# Patient Record
Sex: Female | Born: 1960 | Race: White | Hispanic: No | Marital: Married | Smoking: Current every day smoker
Health system: Southern US, Community
[De-identification: ages and names within clinical notes are randomized; demographics above are authoritative.]

## PROBLEM LIST (undated history)

## (undated) DIAGNOSIS — C801 Malignant (primary) neoplasm, unspecified: Secondary | ICD-10-CM

## (undated) DIAGNOSIS — I10 Essential (primary) hypertension: Secondary | ICD-10-CM

## (undated) HISTORY — PX: RECTAL SURGERY: SHX760

---

## 2002-12-20 DIAGNOSIS — G5600 Carpal tunnel syndrome, unspecified upper limb: Secondary | ICD-10-CM | POA: Insufficient documentation

## 2005-10-14 ENCOUNTER — Emergency Department: Payer: Self-pay | Admitting: Emergency Medicine

## 2005-10-15 ENCOUNTER — Other Ambulatory Visit: Payer: Self-pay

## 2005-10-15 ENCOUNTER — Emergency Department: Payer: Self-pay | Admitting: Emergency Medicine

## 2006-05-12 ENCOUNTER — Emergency Department: Payer: Self-pay | Admitting: Emergency Medicine

## 2006-08-29 ENCOUNTER — Emergency Department: Payer: Self-pay | Admitting: Emergency Medicine

## 2007-06-02 ENCOUNTER — Emergency Department: Payer: Self-pay | Admitting: Unknown Physician Specialty

## 2007-10-31 DIAGNOSIS — I10 Essential (primary) hypertension: Secondary | ICD-10-CM | POA: Insufficient documentation

## 2008-09-24 ENCOUNTER — Emergency Department: Payer: Self-pay | Admitting: Emergency Medicine

## 2008-11-24 ENCOUNTER — Emergency Department: Payer: Self-pay | Admitting: Emergency Medicine

## 2008-12-06 ENCOUNTER — Emergency Department: Payer: Self-pay | Admitting: Emergency Medicine

## 2009-10-01 ENCOUNTER — Emergency Department: Payer: Self-pay | Admitting: Emergency Medicine

## 2011-05-21 ENCOUNTER — Inpatient Hospital Stay: Payer: Self-pay | Admitting: Internal Medicine

## 2012-06-22 ENCOUNTER — Emergency Department: Payer: Self-pay | Admitting: Emergency Medicine

## 2012-12-28 DIAGNOSIS — C210 Malignant neoplasm of anus, unspecified: Secondary | ICD-10-CM | POA: Insufficient documentation

## 2013-03-23 ENCOUNTER — Emergency Department: Payer: Self-pay | Admitting: Emergency Medicine

## 2013-03-23 LAB — COMPREHENSIVE METABOLIC PANEL
Albumin: 3.8 g/dL (ref 3.4–5.0)
Alkaline Phosphatase: 84 U/L (ref 50–136)
Calcium, Total: 8.8 mg/dL (ref 8.5–10.1)
Co2: 28 mmol/L (ref 21–32)
Osmolality: 261 (ref 275–301)
Potassium: 2.7 mmol/L — ABNORMAL LOW (ref 3.5–5.1)
SGOT(AST): 40 U/L — ABNORMAL HIGH (ref 15–37)
SGPT (ALT): 42 U/L (ref 12–78)
Total Protein: 6.9 g/dL (ref 6.4–8.2)

## 2013-03-23 LAB — CBC
HCT: 38.7 % (ref 35.0–47.0)
MCHC: 36.4 g/dL — ABNORMAL HIGH (ref 32.0–36.0)
MCV: 93 fL (ref 80–100)
Platelet: 126 10*3/uL — ABNORMAL LOW (ref 150–440)
RBC: 4.19 10*6/uL (ref 3.80–5.20)
WBC: 6.8 10*3/uL (ref 3.6–11.0)

## 2013-10-27 ENCOUNTER — Observation Stay: Payer: Self-pay | Admitting: Specialist

## 2013-10-27 LAB — COMPREHENSIVE METABOLIC PANEL
Albumin: 3.9 g/dL (ref 3.4–5.0)
Alkaline Phosphatase: 81 U/L
Anion Gap: 6 — ABNORMAL LOW (ref 7–16)
BUN: 10 mg/dL (ref 7–18)
Bilirubin,Total: 0.8 mg/dL (ref 0.2–1.0)
Calcium, Total: 8.3 mg/dL — ABNORMAL LOW (ref 8.5–10.1)
Chloride: 90 mmol/L — ABNORMAL LOW (ref 98–107)
Co2: 31 mmol/L (ref 21–32)
Creatinine: 0.76 mg/dL (ref 0.60–1.30)
EGFR (African American): >60
EGFR (Non-African Amer.): >60
Glucose: 120 mg/dL — ABNORMAL HIGH (ref 65–99)
Osmolality: 255 (ref 275–301)
Potassium: 2.3 mmol/L — CL (ref 3.5–5.1)
SGOT(AST): 39 U/L — ABNORMAL HIGH (ref 15–37)
SGPT (ALT): 33 U/L (ref 12–78)
Sodium: 127 mmol/L — ABNORMAL LOW (ref 136–145)
Total Protein: 8.1 g/dL (ref 6.4–8.2)

## 2013-10-27 LAB — CBC
HCT: 44.1 % (ref 35.0–47.0)
HGB: 15.6 g/dL (ref 12.0–16.0)
MCH: 33.3 pg (ref 26.0–34.0)
MCHC: 35.4 g/dL (ref 32.0–36.0)
MCV: 94 fL (ref 80–100)
Platelet: 93 10*3/uL — ABNORMAL LOW (ref 150–440)
RBC: 4.69 10*6/uL (ref 3.80–5.20)
RDW: 11.8 % (ref 11.5–14.5)
WBC: 7.8 10*3/uL (ref 3.6–11.0)

## 2013-10-27 LAB — URINALYSIS, COMPLETE
Bacteria: NONE SEEN
Bilirubin,UR: NEGATIVE
Glucose,UR: NEGATIVE mg/dL (ref 0–75)
Leukocyte Esterase: NEGATIVE
Nitrite: NEGATIVE
Ph: 6 (ref 4.5–8.0)
Protein: >=500
RBC,UR: 3 /HPF (ref 0–5)
Specific Gravity: 1.018 (ref 1.003–1.030)
Squamous Epithelial: 2
WBC UR: 8 /HPF (ref 0–5)

## 2013-10-27 LAB — CLOSTRIDIUM DIFFICILE(ARMC)

## 2013-10-27 LAB — LIPASE, BLOOD: Lipase: 81 U/L (ref 73–393)

## 2013-10-27 LAB — MAGNESIUM: Magnesium: 1.6 mg/dL — ABNORMAL LOW

## 2013-10-28 LAB — CBC WITH DIFFERENTIAL/PLATELET
Basophil #: 0 10*3/uL (ref 0.0–0.1)
Basophil %: 0.3 %
EOS ABS: 0 10*3/uL (ref 0.0–0.7)
Eosinophil %: 0.2 %
HCT: 38.6 % (ref 35.0–47.0)
HGB: 13.7 g/dL (ref 12.0–16.0)
Lymphocyte #: 1.8 10*3/uL (ref 1.0–3.6)
Lymphocyte %: 39.2 %
MCH: 33.5 pg (ref 26.0–34.0)
MCHC: 35.4 g/dL (ref 32.0–36.0)
MCV: 95 fL (ref 80–100)
MONO ABS: 0.5 x10 3/mm (ref 0.2–0.9)
MONOS PCT: 10.5 %
NEUTROS ABS: 2.3 10*3/uL (ref 1.4–6.5)
Neutrophil %: 49.8 %
Platelet: 81 10*3/uL — ABNORMAL LOW (ref 150–440)
RBC: 4.08 10*6/uL (ref 3.80–5.20)
RDW: 11.8 % (ref 11.5–14.5)
WBC: 4.7 10*3/uL (ref 3.6–11.0)

## 2013-10-28 LAB — BASIC METABOLIC PANEL
ANION GAP: 6 — AB (ref 7–16)
BUN: 5 mg/dL — ABNORMAL LOW (ref 7–18)
CO2: 28 mmol/L (ref 21–32)
Calcium, Total: 8.1 mg/dL — ABNORMAL LOW (ref 8.5–10.1)
Chloride: 102 mmol/L (ref 98–107)
Creatinine: 0.64 mg/dL (ref 0.60–1.30)
EGFR (Non-African Amer.): >60
GLUCOSE: 96 mg/dL (ref 65–99)
Osmolality: 269 (ref 275–301)
POTASSIUM: 3.3 mmol/L — AB (ref 3.5–5.1)
Sodium: 136 mmol/L (ref 136–145)

## 2013-10-28 LAB — MAGNESIUM: MAGNESIUM: 2 mg/dL

## 2013-10-30 LAB — STOOL CULTURE

## 2013-11-01 LAB — CULTURE, BLOOD (SINGLE)

## 2014-11-30 NOTE — H&P (Signed)
PATIENT NAME:  Lindsey Park, Lindsey Park MR#:  [id number] DATE OF BIRTH:  [birth year]  DATE OF ADMISSION:  10/27/2013  PRIMARY CARE PHYSICIAN: Dr. Delight Stare.   CHIEF COMPLAINT: Fever, chills, runny nose, diarrhea, and weakness.   This is a 54 year old female with a history of hypertension, tobacco dependence, anxiety, presents with the above complaint. Over the past 5 to 6 days, the patient has had fevers, chills, nausea, loose stools (which is now diarrhea today), poor p.o. intake, and lethargy. She is not improving, so she came here for further evaluation. She said she has had loose stools all week, but today she has had a lot of watery diarrhea. She is unable to take in anything for the past few days. She has tried to eat, but she just feels nauseous every time she eats. She denies any abdominal pain. She has a cough, which is dry. She also has a runny nose. In the ER, she had a temperature of 101. Her potassium and magnesium were low.  REVIEW OF SYSTEMS:   CONSTITUTIONAL: Positive fever. Positive fatigue and weakness.  EYES: No blurred or double vision, glaucoma or cataracts. EARS, NOSE, THROAT: Positive headache. Positive hard of hearing. No postnasal drip. No epistaxis, tinnitus, snoring,  RESPIRATORY: Positive cough. No wheezing, hemoptysis, dyspnea, asthma, painful respirations.  CARDIOVASCULAR: Denies any chest pain, orthopnea, palpitations, syncope, edema.  GASTROINTESTINAL: Positive nausea. No vomiting. Positive diarrhea. No abdominal pain. No melena or ulcers.  GENITOURINARY: No dysuria or hematuria. ENDOCRINE: No polyuria or polydipsia. HEMATOLOGIC AND LYMPHATIC: No anemia or easy bruising. SKIN: No rash or lesions.  MUSCULOSKELETAL: No pain in the shoulders or knees. No arthritis.  NEUROLOGIC: No history of CVA, TIA, seizures. PSYCHIATRIC: Positive anxiety.   PAST MEDICAL HISTORY: 1.  Hypertension.  2.  Anal cancer.  3.  Anxiety.  4.  Tobacco dependence.  SOCIAL HISTORY: The patient  smokes 1 pack every 2 days. No alcohol or IV drug use. The patient was counseled for 4 minutes regarding smoking. She is trying to cut down on smoking. She does not want a nicotine patch.   PAST SURGICAL HISTORY:  1.  Anal cancer surgery.  2.  Hysterectomy.  FAMILY HISTORY: Positive for depression.   ALLERGIES: SULFA CAUSED A RASH. AMBIEN CAUSED ANXIETY.   MEDICATIONS: 1.  Atenolol/chlorthalidone 50/25, 1 tablet daily.  2.  Xanax 0.5 mg t.i.d.   PHYSICAL EXAMINATION: VITAL SIGNS: Temperature 101, pulse 105, respirations 20, blood pressure 134/80, 93% on room air.  GENERAL: The patient is alert, oriented, not in acute distress. HEENT: Head is atraumatic. Pupils are round and reactive. Sclerae anicteric. Mucous membranes are dry. Oropharynx is clear.  NECK: Supple without JVD, carotid bruit, or enlarged thyroid.  CARDIOVASCULAR: Tachycardia. No murmurs, gallops, or rubs. PMI is not displaced. LUNGS: Clear to auscultation without crackles, rales, rhonchi, or wheezing. Normal to percussion. No egophony. Normal chest expansion.  BACK: No CVA or vertebral tenderness.  ABDOMEN: Bowel sounds are positive. Nontender, nondistended. No hepatosplenomegaly.  No rebound or guarding.  EXTREMITIES: No clubbing, cyanosis, or edema.  NEUROLOGIC: Cranial nerves II through XII are intact. There are no focal deficits.  SKIN: Without rashes or lesions.  MUSCULOSKELETAL: Shows 5/5 strength in all extremities.   LABORATORY, DIAGNOSTIC, AND RADIOLOGICAL DATA: Sodium 127, potassium 2.3, chloride 90, bicarbonate 31, BUN 10, creatinine 0.76, glucose 120, calcium 8.3, bilirubin 0.8, alkaline phosphatase 81, ALT 33, AST 39, total protein 8.1, albumin 3.9. White blood cells 7.8, hemoglobin 15.6, hematocrit 45; platelets are 93. Lipase  81. Urinalysis is negative for LCE or nitrites. Magnesium 1.6.   Chest x-ray shows no acute cardiopulmonary disease.   EKG: Normal sinus rhythm. No ST elevation or depression.    ASSESSMENT AND PLAN: This is a 54 year old female with a history of hypertension, tobacco dependence, and anxiety who presents with systemic inflammatory response syndrome secondary to likely viral syndrome.  1.  Systemic inflammatory response syndrome. The patient has tachycardia and fever on admission, likely secondary to viral syndrome. Her symptoms are most consistent with a viral syndrome with fever, chills, cough, and runny nose. She now has developed some diarrhea. Blood cultures were ordered in the ER, which we will follow up on. No antibiotics at this time.  2.  Significant electrolyte abnormalities including hypokalemia and hypomagnesemia secondary to poor p.o. intake and diarrhea. We will replete and recheck in the a.m.  3.  Hyponatremia secondary to poor p.o. intake. The patient is also on a diuretic hypertensive medication, which we will stop for now, which could lead to low sodium levels as well. Will replete with IV fluids. Recheck in the a.m.  4.  Diarrhea. The patient has had increasing diarrhea this morning. Will check C. difficile as well as stool cultures. No antibiotics at this time, as I suspect this is viral syndrome.  5.  Tobacco dependence. The patient was counseled regarding stopping smoking for 4 minutes. She is in the process of decreasing her cigarette use. Not interested in totally quitting at this point. She does not want a nicotine patch.  6.  Anxiety. Will continue with her outpatient medications.  7.  Thrombocytopenia, likely secondary to systemic inflammatory response syndrome. Will repeat a CBC in the a.m. Holding Lovenox or heparin for deep vein thrombosis prophylaxis due to her low platelets.  8.  The patient is full code status.   TIME SPENT: Approximately 45 minutes.    ____________________________ Donell Beers. Benjie Karvonen, MD spm:jcm D: 10/27/2013 13:50:59 ET T: 10/27/2013 14:45:21 ET JOB#: [id number]  cc: Marisa Hage P. Benjie Karvonen, MD, <Dictator> Marguerita Merles, MD Donell Beers  Christyna Letendre MD ELECTRONICALLY SIGNED 10/27/2013 19:04

## 2014-11-30 NOTE — Discharge Summary (Signed)
PATIENT NAME:  Lindsey Park, Lindsey Park MR#:  [id number] DATE OF BIRTH:  23-Dec-1960  DATE OF ADMISSION:  10/27/2013 DATE OF DISCHARGE:  10/28/2013  For a detailed note, please take a look at the history and physical done on admission by Dr. Benjie Karvonen.  DISCHARGE DIAGNOSES:  1. Viral gastroenteritis.  2.  Hypokalemia secondary to the diarrhea. 3.  Hyponatremia, also related to volume loss from the diarrhea.  4.  Hypertension.  5.  Anxiety.   DISCHARGE DIET: The patient is being discharged on a low-sodium, regular consistency diet.   DISCHARGE ACTIVITY: As tolerated.   DISCHARGE INSTRUCTIONS: Follow up with Dr. Delight Stare in the next 1 to 2 weeks.  DISCHARGE MEDICATIONS: Atenolol/chlorthalidone 50/25 one tab daily, Xanax 0.5 mg t.i.d. as needed, loperamide 2 mg q.i.d. as needed for diarrhea, and potassium 10 mEq b.i.d. x5 days.   PERTINENT STUDIES DONE DURING THE HOSPITAL COURSE: A chest x-ray done on admission showing no acute cardiopulmonary disease.   The patient's blood culture is noted to be negative. Stool cultures are negative. C. diff toxin assay also negative.   HOSPITAL COURSE: This is a 54 year old female who presented to the hospital with fever, diarrhea, and body aches, and noted to also have several electrolyte abnormalities with severe hypokalemia and also hyponatremia.  1.  Viral gastroenteritis. This was likely the cause of the patient's diarrhea, fever, and body aches. The patient with supportive care has significantly improved. She was hydrated with IV fluids, given antiemetics, antidiarrheals. She still continues to have diarrhea, but it has improved. She has been afebrile now for the past 24 hours. Her blood cultures are negative. Her stool was negative for C. diff and comprehensive culture. Her electrolytes have improved with supplementation. At this point, she will continue some p.r.n. Imodium for her diarrhea and follow up with her primary care physician as an outpatient.  2.   Hypokalemia. The patient's potassium was as low as 2.3. It has come up to 3.3 overnight with supplementation. I am discharging her on some oral potassium for the next few days.  3.  Hyponatremia. This was likely secondary to hypovolemic hyponatremia from the diarrhea and volume loss. The patient has received some IV fluids and her sodium has since then improved.  4.  Hypertension. The patient has remained hemodynamically stable. She will continue her atenolol and chlorthalidone.  5.  Anxiety. The patient was maintained on her Xanax and she will resume that.   CODE STATUS: The patient is a FULL code.   TIME SPENT: 35 minutes.   ____________________________ Belia Heman. Verdell Carmine, MD vjs:sb D: 10/28/2013 16:34:53 ET T: 10/29/2013 07:23:39 ET JOB#: [id number]  cc: Belia Heman. Verdell Carmine, MD, <Dictator> Marguerita Merles, MD Henreitta Leber MD ELECTRONICALLY SIGNED 11/02/2013 20:27

## 2014-12-03 DIAGNOSIS — R200 Anesthesia of skin: Secondary | ICD-10-CM | POA: Insufficient documentation

## 2014-12-09 DIAGNOSIS — M5430 Sciatica, unspecified side: Secondary | ICD-10-CM | POA: Insufficient documentation

## 2014-12-28 DIAGNOSIS — R7401 Elevation of levels of liver transaminase levels: Secondary | ICD-10-CM | POA: Insufficient documentation

## 2014-12-28 DIAGNOSIS — R7402 Elevation of levels of lactic acid dehydrogenase (LDH): Secondary | ICD-10-CM | POA: Insufficient documentation

## 2015-01-17 ENCOUNTER — Emergency Department
Admission: EM | Admit: 2015-01-17 | Discharge: 2015-01-17 | Disposition: A | Discharge status: Home / Self Care | Payer: Self-pay | Attending: Emergency Medicine | Admitting: Emergency Medicine

## 2015-01-17 ENCOUNTER — Encounter: Payer: Self-pay | Admitting: Emergency Medicine

## 2015-01-17 ENCOUNTER — Other Ambulatory Visit: Payer: Self-pay

## 2015-01-17 DIAGNOSIS — F419 Anxiety disorder, unspecified: Secondary | ICD-10-CM | POA: Insufficient documentation

## 2015-01-17 DIAGNOSIS — I10 Essential (primary) hypertension: Secondary | ICD-10-CM | POA: Insufficient documentation

## 2015-01-17 DIAGNOSIS — Z79899 Other long term (current) drug therapy: Secondary | ICD-10-CM | POA: Insufficient documentation

## 2015-01-17 DIAGNOSIS — Z720 Tobacco use: Secondary | ICD-10-CM | POA: Insufficient documentation

## 2015-01-17 HISTORY — DX: Malignant (primary) neoplasm, unspecified: C80.1

## 2015-01-17 HISTORY — DX: Essential (primary) hypertension: I10

## 2015-01-17 MED ORDER — ALPRAZOLAM 0.5 MG PO TABS: 0.5000 mg | ORAL_TABLET | Freq: Three times a day (TID) | ORAL | Status: DC | PRN

## 2015-01-17 MED ORDER — PANTOPRAZOLE SODIUM 20 MG PO TBEC: 20.0000 mg | DELAYED_RELEASE_TABLET | Freq: Every day | ORAL | Status: DC

## 2015-01-17 MED ORDER — ACETAMINOPHEN 500 MG PO TABS
1000.0000 mg | ORAL_TABLET | ORAL | Status: AC
  Administered 2015-01-17: 1000 mg via ORAL

## 2015-01-17 MED ORDER — ALPRAZOLAM 0.5 MG PO TABS
ORAL_TABLET | ORAL | Status: AC
Start: 2015-01-17 — End: 2015-01-17
  Administered 2015-01-17: 1 mg via ORAL
  Filled 2015-01-17: qty 2

## 2015-01-17 MED ORDER — ALPRAZOLAM 0.5 MG PO TABS
1.0000 mg | ORAL_TABLET | ORAL | Status: AC
  Administered 2015-01-17: 1 mg via ORAL

## 2015-01-17 MED ORDER — ACETAMINOPHEN 500 MG PO TABS
ORAL_TABLET | ORAL | Status: AC
  Administered 2015-01-17: 1000 mg via ORAL
  Filled 2015-01-17: qty 2

## 2015-01-17 NOTE — ED Notes (Signed)
Pt resting with friends at bedside. Lights turned down to help her rest.

## 2015-01-17 NOTE — ED Notes (Signed)
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 Pt states she is sleeping well but feeling overwhelmed.

## 2015-01-17 NOTE — ED Notes (Signed)
Provided pt with [place] sandwich. HA improving since tylenol.

## 2015-01-17 NOTE — ED Provider Notes (Signed)
[facility] Emergency Department Provider Note  ____________________________________________  Time seen: Approximately 10:22 AM  I have reviewed the triage vital signs and the nursing notes.   HISTORY  Chief Complaint Anxiety and Hypertension    HPI Lindsey Park is a 54 y.o. female states that she's been under a lot of stress recently T and care of her family member. She notes she went to work today and "just couldn't take it" because she feels so overwhelmed with life. She started to "panic" and states that she started feeling numb and tingling in her fingers and was hyperventilating. She denies having any pain. She has been treated previously for anxiety but stopped medication 6 months ago, she was also treated with some type of anti-present for about a month and stopped that because it wasn't helping.  She denies wanting to harm herself or anyone else. She states she would never hurt herself. She is feels that she doesn't have helped, and that her family doesn't care about her husbands  Sickness.  She reports feeling very anxious. He does not feel hopeless.  She has no personal history of suicidal thoughts. She does suffer from depression and anxiety.  Past Medical History  Diagnosis Date  . Hypertension   . Cancer     There are no active problems to display for this patient.   Past Surgical History  Procedure Laterality Date  . Rectal surgery      Current Outpatient Rx  Name  Route  Sig  Dispense  Refill  . ALPRAZolam (XANAX) 0.5 MG tablet   Oral   Take 1 tablet (0.5 mg total) by mouth 3 (three) times daily as needed for anxiety.   30 tablet   0   . pantoprazole (PROTONIX) 20 MG tablet   Oral   Take 1 tablet (20 mg total) by mouth daily.   30 tablet   1     Allergies Sulfa antibiotics  History reviewed. No pertinent family history.  Social History History  Substance Use Topics  . Smoking status: Current Every Day Smoker --  0.50 packs/day for 20 years    Types: Cigarettes  . Smokeless tobacco: Never Used  . Alcohol Use: 2.4 oz/week    4 Cans of beer per week    Review of Systems Constitutional: No fever/chills Eyes: No visual changes. ENT: No sore throat. Cardiovascular: Denies chest pain. Respiratory: She felt short of breath during her "panic" but this is resolved. Gastrointestinal: No abdominal pain.  No nausea, no vomiting.  No diarrhea.  No constipation. Genitourinary: Negative for dysuria. Musculoskeletal: Negative for back pain. Skin: Negative for rash. Neurological: Negative for headaches, focal weakness or numbness.  10-point ROS otherwise negative.  ____________________________________________   PHYSICAL EXAM:  VITAL SIGNS: ED Triage Vitals  Enc Vitals Group     BP 01/17/15 0747 170/108 mmHg     Pulse Rate 01/17/15 0747 89     Resp 01/17/15 0747 18     Temp 01/17/15 0747 98 F (36.7 C)     Temp Source 01/17/15 0747 Oral     SpO2 01/17/15 0747 98 %     Weight 01/17/15 0747 140 lb (63.504 kg)     Height 01/17/15 0747 5\' 6"  (1.676 m)     Head Cir --      Peak Flow --      Pain Score 01/17/15 0749 6     Pain Loc --      Pain Edu? --  Excl. in [place]? --     Constitutional: Alert and oriented. Well appearing and in no acute distress. She is tearful, stating that she "shouldn't have come here" but she " just panicked" and being under stress Eyes: Conjunctivae are normal. PERRL. EOMI. Head: Atraumatic. Nose: No congestion/rhinnorhea. Mouth/Throat: Mucous membranes are moist.  Oropharynx non-erythematous. Neck: No stridor.   Cardiovascular: Normal rate, regular rhythm. Grossly normal heart sounds.  Good peripheral circulation. Respiratory: Normal respiratory effort.  No retractions. Lungs CTAB. Gastrointestinal: Soft and nontender. No distention. No abdominal bruits. No CVA tenderness. Musculoskeletal: No lower extremity tenderness nor edema.  No joint effusions. Neurologic:   Normal speech and language. No gross focal neurologic deficits are appreciated. Speech is normal. No gait instability. Skin:  Skin is warm, dry and intact. No rash noted. Psychiatric: Mood and affect are normal. Speech and behavior are normal.  Anxious appearing, otherwise normal exam.  ____________________________________________   LABS (all labs ordered are listed, but only abnormal results are displayed)  Labs Reviewed - No data to display ____________________________________________  EKG   Date: 01/17/2015  Rate: 80  Rhythm: normal sinus rhythm  QRS Axis: normal  Intervals: normal except QTc of 490  ST/T Wave abnormalities: normal  Conduction Disutrbances: none  Narrative Interpretation: unremarkable except for mild prolongation of QT     ____________________________________________  RADIOLOGY   ____________________________________________   PROCEDURES  Procedure(s) performed: None  Critical Care performed: No  ____________________________________________   INITIAL IMPRESSION / ASSESSMENT AND PLAN / ED COURSE  Pertinent labs & imaging results that were available during my care of the patient were reviewed by me and considered in my medical decision making (see chart for details).  Patient presents with what appears to been a panic episode because of being under heavy stress in her life. She denies being homicidal or suicidal. I discussed with her, and she does want to have follow-up with psychiatry and for treatment of the stress in her life. She does have support from close friends were with her today. They all deny that she would never hurt herself, and that she is not talked about any thoughts of hurting herself or being suicidal.  I think the patient is suffering from depression and anxiety. I believe in her instance, a brief course of Xanax and close follow-up is now patient would be appropriate. Her EKG is normal, and her symptoms match that of a panic  type episode. I will have TTS see her to assist in setting up outpatient care.  ----------------------------------------- 11:20 AM on 01/17/2015 -----------------------------------------  Patient is calm. She appears much more comfortable. She does states she's having a moderate headache, but feels that she has headaches on and off frequently because of stress. This is no different than previous headaches. She denies any numbness or tingling and she is fully awake and alert and well-appearing.  Continue to await consultation from TTS. I anticipate discharging her home. I did discuss with the patient safe use of Xanax and only use as prescribed. We'll give her very short course until she can get follow-up with psychiatric care and her doctor. Patient does not take any narcotic pain medicines. She does not take any other benzodiazepine's. She does occasionally take Flexeril, but agrees not to use this during the same days her periods when she is using Xanax. She agrees not to drive while using Xanax. ____________________________________________  ----------------------------------------- 2:04 PM on 01/17/2015 -----------------------------------------  Patient resting comfortably. Seen by TTS and will follow up with [facility] on  Monday. I will give her a brief prescription for Xanax, she also ran out of her Protonix which I will give her a one-month refill.   FINAL CLINICAL IMPRESSION(S) / ED DIAGNOSES  Final diagnoses:  Anxiety      Delman Kitten, MD 01/17/15 1408

## 2015-01-17 NOTE — ED Notes (Signed)
Pt BIB [facility] from work. Per pt she woke up this AM not feeling herself. She is out of BP medicine and has been off anxiety medicine for 6 months. States she got to work late and was emotional, crying and hyperventilating. Pt coworkers called 911. Per EMS, [facility] and [facility]. Pt also reports headache starting today.

## 2015-01-17 NOTE — BH Assessment (Signed)
Assessment Note  Lindsey Park is an 54 y.o. female who presents to the ER due to having a panic attack at work. According to the pt., as she prepared for work today, going through her daily routine, she "wasn't feeling right." She stated she was having cold chills and shakiness. However, she went to work due to being the only one in her home working. When she arrived at work, one of her regular customers, jokingly said, "you are late." It resulted in the patient, crying uncontrollably, shaking and confused. Her blood pressure was evaluated and felt like she was "going to pass out." Thus, she was brought to the ER.  Pt. Reports of having ongoing stress due to being the primary care giver for her husband. Pt. States, her husband is chronically ill and have work with Hospice on two separate occasions.  However, when they believe he is in about to expire, his health improves. He has  "chronic COPD."  The husbands, PCP has arranged for the wife to get additional help, via an in home aid. However, the husband refused to allow them to come to and in the house.  When she talked to him about needing the additional help, because it is to overwhelming for her, he would make statements that make her feel guilty. Pt. Further reports of being the only one working. 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   Pt. Has been with her husband for 18 years. The last 6 years, she has been his care taker due to his COPD.  She states she has limited support from her family. She states, "no one checks on me but I'm always checking on them."  She has four children and her mother. She did identified having two close friends, they were in the room during the time of the interview. She states they are a support for her. However, they are limited to what they can do to help.  Pt. Was prescribed Xanax from there PCP. However, due to her not taking a urine test with the office,  they discontinued to the medications. Pt. Denies drug use. The day she didn't submit the UDS, she reports of forgetting to do it. Per her report, she seen her PCP and was suppose to submit the specimen.  However, she walked to the front, scheduled hr next appointment and forgot about it. Thus, she received a letter, several days latter, stating the doctor was discontinued her medication.  She reports of being on it for approximately 7 years. She has gone without for approximately 6 months. Until today, the pt. Believe she was managing her anxiety.  Pt. Denies current and past SI/HI and AV/H. She denies any involvement with the legal symptom.  She also denies having a history mental health treatment. However, today she is going to seek an out patient provider. Writer provider the pt. With referral information, with instructions, for [facility].     Axis I: Anxiety Disorder NOS and Panic Disorder Axis III:  Past Medical History  Diagnosis Date  . Hypertension   . Cancer    Axis IV: economic problems, housing problems, occupational problems, other psychosocial or environmental problems, problems related to social environment and problems with primary support group  Past Medical History:  Past Medical History  Diagnosis Date  . Hypertension   . Cancer     Past Surgical History  Procedure Laterality Date  . Rectal surgery  Family History: History reviewed. No pertinent family history.  Social History:  reports that she has been smoking Cigarettes.  She has a 10 pack-year smoking history. She has never used smokeless tobacco. She reports that she drinks about 2.4 oz of alcohol per week. She reports that she does not use illicit drugs.  Additional Social History:  Alcohol / Drug Use Pain Medications: None Reported Prescriptions: None Reported Over the Counter: None Reported History of alcohol / drug use?: No history of alcohol / drug abuse Negative Consequences of Use:  (None  Reported) Withdrawal Symptoms:  (None Reported)  CIWA: CIWA-Ar BP: (!) 148/99 mmHg Pulse Rate: 89 COWS:    Allergies:  Allergies  Allergen Reactions  . Sulfa Antibiotics Shortness Of Breath and Rash    Home Medications:  (Not in a hospital admission)  OB/GYN Status:  No LMP recorded. Patient has had a hysterectomy.  General Assessment Data Location of Assessment: [facility] ED TTS Assessment: In system Is this a Tele or Face-to-Face Assessment?: Face-to-Face Is this an Initial Assessment or a Re-assessment for this encounter?: Initial Assessment Marital status: Married [place] name: n/a Is patient pregnant?: No Pregnancy Status: No Living Arrangements: Spouse/significant other Can pt return to current living arrangement?: Yes Admission Status: Voluntary Is patient capable of signing voluntary admission?: Yes Referral Source: Self/Family/Friend Insurance type: n/a  Medical Screening Exam ([employer]) Medical Exam completed: Yes  Crisis Care Plan Living Arrangements: Spouse/significant other Name of Psychiatrist: n/a Name of Therapist: n/a  Education Status Is patient currently in school?: No Current Grade: n/a Highest grade of school patient has completed: 12 Name of school: n/a Contact person: n/a  Risk to self with the past 6 months Suicidal Ideation: No Has patient been a risk to self within the past 6 months prior to admission? : No Suicidal Intent: No Has patient had any suicidal intent within the past 6 months prior to admission? : No Is patient at risk for suicide?: No Suicidal Plan?: No Has patient had any suicidal plan within the past 6 months prior to admission? : No Access to Means: No What has been your use of drugs/alcohol within the last 12 months?: None reported Previous Attempts/Gestures: No How many times?: 0 Other Self Harm Risks: None Reported Triggers for Past Attempts: Family contact, Other (Comment) (Pt. stressed due to being  overwhelmed) Intentional Self Injurious Behavior: None Family Suicide History: Unknown Recent stressful life event(s): Loss (Comment), Trauma (Comment), Financial Problems, Other (Comment) Persecutory voices/beliefs?: No Depression: Yes Depression Symptoms: Guilt, Fatigue, Tearfulness, Feeling worthless/self pity, Feeling angry/irritable Substance abuse history and/or treatment for substance abuse?: No Suicide prevention information given to non-admitted patients: Not applicable  Risk to Others within the past 6 months Homicidal Ideation: No Does patient have any lifetime risk of violence toward others beyond the six months prior to admission? : No Thoughts of Harm to Others: No Current Homicidal Intent: No Current Homicidal Plan: No Access to Homicidal Means: No Identified Victim: None Reported History of harm to others?: No Assessment of Violence: None Noted Violent Behavior Description: None Reported Does patient have access to weapons?: No Criminal Charges Pending?: No Does patient have a court date: No Is patient on probation?: No  Psychosis Hallucinations: None noted Delusions: None noted  Mental Status Report Appearance/Hygiene: In hospital gown, In scrubs, Unremarkable Eye Contact: Good Motor Activity: Freedom of movement, Unremarkable, Restlessness Speech: Logical/coherent Level of Consciousness: Alert Mood: Depressed, Anxious, Sad, Pleasant Affect: Anxious, Sad Anxiety Level: Moderate Thought Processes: Coherent, Relevant  Judgement: Unimpaired Orientation: Person, Place, Time, Situation, Appropriate for developmental age Obsessive Compulsive Thoughts/Behaviors: Minimal  Cognitive Functioning Concentration: Normal Memory: Recent Intact, Remote Intact IQ: Average Insight: Fair Impulse Control: Fair Appetite: Fair Weight Loss: 0 Weight Gain: 0 Sleep: No Change Total Hours of Sleep: 8 Vegetative Symptoms: None  ADLScreening [facility] Assessment  Services) Patient's cognitive ability adequate to safely complete daily activities?: Yes Patient able to express need for assistance with ADLs?: Yes Independently performs ADLs?: Yes (appropriate for developmental age)  Prior Inpatient Therapy Prior Inpatient Therapy: No Prior Therapy Dates: n/a Prior Therapy Facilty/Provider(s): n/a Reason for Treatment: n/a  Prior Outpatient Therapy Prior Outpatient Therapy: No Prior Therapy Dates: n/a Prior Therapy Facilty/Provider(s): n/a Reason for Treatment: n/a Does patient have an ACCT team?: No Does patient have Intensive In-House Services?  : No Does patient have [facility] services? : No Does patient have P4CC services?: No  ADL Screening (condition at time of admission) Patient's cognitive ability adequate to safely complete daily activities?: Yes Patient able to express need for assistance with ADLs?: Yes Independently performs ADLs?: Yes (appropriate for developmental age)       Abuse/Neglect Assessment (Assessment to be complete while patient is alone) Physical Abuse: Denies Verbal Abuse: Denies Sexual Abuse: Denies Exploitation of patient/patient's resources: Denies Self-Neglect: Denies Values / Beliefs Cultural Requests During Hospitalization: None Spiritual Requests During Hospitalization: None Consults Spiritual Care Consult Needed: No Social Work Consult Needed: No Regulatory affairs officer (For Healthcare) Does patient have an advance directive?: No Would patient like information on creating an advanced directive?: No - patient declined information    Additional Information 1:1 In Past 12 Months?: No CIRT Risk: No Elopement Risk: No Does patient have medical clearance?: Yes  Child/Adolescent Assessment Running Away Risk: Denies (Pt. is an adult)  Disposition:  Disposition Initial Assessment Completed for this Encounter: Yes Disposition of Patient: Outpatient treatment Type of outpatient treatment: Adult  ([facility])  On Site Evaluation by:   Reviewed with Physician:    Gunnar Fusi, MS, LCAS, LPC, [place], CCSI 01/17/2015 2:56 PM

## 2015-01-17 NOTE — Discharge Instructions (Signed)

## 2015-02-11 DIAGNOSIS — B182 Chronic viral hepatitis C: Secondary | ICD-10-CM | POA: Insufficient documentation

## 2015-03-28 ENCOUNTER — Encounter: Payer: Self-pay | Admitting: Emergency Medicine

## 2015-03-28 ENCOUNTER — Other Ambulatory Visit: Payer: Self-pay

## 2015-03-28 ENCOUNTER — Emergency Department
Admission: EM | Admit: 2015-03-28 | Discharge: 2015-03-28 | Disposition: A | Discharge status: Home / Self Care | Payer: Self-pay | Attending: Emergency Medicine | Admitting: Emergency Medicine

## 2015-03-28 ENCOUNTER — Emergency Department: Payer: Self-pay

## 2015-03-28 DIAGNOSIS — R1010 Upper abdominal pain, unspecified: Secondary | ICD-10-CM

## 2015-03-28 DIAGNOSIS — R1011 Right upper quadrant pain: Secondary | ICD-10-CM | POA: Insufficient documentation

## 2015-03-28 DIAGNOSIS — Z720 Tobacco use: Secondary | ICD-10-CM | POA: Insufficient documentation

## 2015-03-28 DIAGNOSIS — I10 Essential (primary) hypertension: Secondary | ICD-10-CM | POA: Insufficient documentation

## 2015-03-28 DIAGNOSIS — R740 Nonspecific elevation of levels of transaminase and lactic acid dehydrogenase [LDH]: Secondary | ICD-10-CM | POA: Insufficient documentation

## 2015-03-28 DIAGNOSIS — E876 Hypokalemia: Secondary | ICD-10-CM | POA: Insufficient documentation

## 2015-03-28 DIAGNOSIS — Z79899 Other long term (current) drug therapy: Secondary | ICD-10-CM | POA: Insufficient documentation

## 2015-03-28 DIAGNOSIS — R7401 Elevation of levels of liver transaminase levels: Secondary | ICD-10-CM

## 2015-03-28 LAB — COMPREHENSIVE METABOLIC PANEL
ALT: 122 U/L — ABNORMAL HIGH (ref 14–54)
ANION GAP: 9 (ref 5–15)
AST: 144 U/L — AB (ref 15–41)
Albumin: 4.1 g/dL (ref 3.5–5.0)
Alkaline Phosphatase: 63 U/L (ref 38–126)
BUN: 8 mg/dL (ref 6–20)
CHLORIDE: 98 mmol/L — AB (ref 101–111)
CO2: 30 mmol/L (ref 22–32)
Calcium: 9.2 mg/dL (ref 8.9–10.3)
Creatinine, Ser: 0.59 mg/dL (ref 0.44–1.00)
Glucose, Bld: 112 mg/dL — ABNORMAL HIGH (ref 65–99)
POTASSIUM: 3.1 mmol/L — AB (ref 3.5–5.1)
Sodium: 137 mmol/L (ref 135–145)
Total Bilirubin: 0.7 mg/dL (ref 0.3–1.2)
Total Protein: 7.6 g/dL (ref 6.5–8.1)

## 2015-03-28 LAB — URINALYSIS COMPLETE WITH MICROSCOPIC (ARMC ONLY)
BACTERIA UA: NONE SEEN
Bilirubin Urine: NEGATIVE
Glucose, UA: NEGATIVE mg/dL
HGB URINE DIPSTICK: NEGATIVE
KETONES UR: NEGATIVE mg/dL
LEUKOCYTES UA: NEGATIVE
NITRITE: NEGATIVE
PH: 8 (ref 5.0–8.0)
PROTEIN: NEGATIVE mg/dL
RBC / HPF: NONE SEEN RBC/hpf (ref 0–5)
Specific Gravity, Urine: 1.004 — ABNORMAL LOW (ref 1.005–1.030)

## 2015-03-28 LAB — CBC
HEMATOCRIT: 43.1 % (ref 35.0–47.0)
Hemoglobin: 15 g/dL (ref 12.0–16.0)
MCH: 33.6 pg (ref 26.0–34.0)
MCHC: 34.9 g/dL (ref 32.0–36.0)
MCV: 96.2 fL (ref 80.0–100.0)
Platelets: 109 10*3/uL — ABNORMAL LOW (ref 150–440)
RBC: 4.48 MIL/uL (ref 3.80–5.20)
RDW: 12.8 % (ref 11.5–14.5)
WBC: 4.6 10*3/uL (ref 3.6–11.0)

## 2015-03-28 LAB — LIPASE, BLOOD: LIPASE: 26 U/L (ref 22–51)

## 2015-03-28 LAB — HCG, QUANTITATIVE, PREGNANCY: hCG, Beta Chain, Quant, S: 2 m[IU]/mL (ref <5)

## 2015-03-28 MED ORDER — IOHEXOL 240 MG/ML SOLN
25.0000 mL | Freq: Once | INTRAMUSCULAR | Status: AC | PRN
  Administered 2015-03-28: 25 mL via ORAL

## 2015-03-28 MED ORDER — POTASSIUM CHLORIDE ER 10 MEQ PO TBCR: 10.0000 meq | EXTENDED_RELEASE_TABLET | Freq: Every day | ORAL | Status: DC

## 2015-03-28 MED ORDER — POTASSIUM CHLORIDE CRYS ER 20 MEQ PO TBCR
40.0000 meq | EXTENDED_RELEASE_TABLET | Freq: Once | ORAL | Status: AC
  Administered 2015-03-28: 40 meq via ORAL
  Filled 2015-03-28: qty 2

## 2015-03-28 MED ORDER — ONDANSETRON HCL 4 MG/2ML IJ SOLN
4.0000 mg | INTRAMUSCULAR | Status: AC
  Administered 2015-03-28: 4 mg via INTRAVENOUS
  Filled 2015-03-28: qty 2

## 2015-03-28 MED ORDER — MORPHINE SULFATE (PF) 4 MG/ML IV SOLN
4.0000 mg | Freq: Once | INTRAVENOUS | Status: AC
  Administered 2015-03-28: 4 mg via INTRAVENOUS
  Filled 2015-03-28: qty 1

## 2015-03-28 MED ORDER — IOHEXOL 350 MG/ML SOLN
85.0000 mL | Freq: Once | INTRAVENOUS | Status: AC | PRN
Start: 2015-03-28 — End: 2015-03-28
  Administered 2015-03-28: 85 mL via INTRAVENOUS

## 2015-03-28 NOTE — Discharge Instructions (Signed)
Your liver function tests are slightly abnormal, but the CT scan today is reassuring along with the rest of your tests. Please follow-up with Dr. Lennox Grumbles early this coming week, and keep your appointment with [facility] gastroenterology. He may alternatively follow up with Dr. Vira Agar of our gastroenterology service here, but either should be appropriate.  Come back to emergency room right away if you develop a fever, yellowing of the skin or eyes, severe abdominal pain, have chest pain, feel weak or dizzy, or other new concerns arise.  Abdominal Pain Many things can cause abdominal pain. Usually, abdominal pain is not caused by a disease and will improve without treatment. It can often be observed and treated at home. Your health care provider will do a physical exam and possibly order blood tests and X-rays to help determine the seriousness of your pain. However, in many cases, more time must pass before a clear cause of the pain can be found. Before that point, your health care provider may not know if you need more testing or further treatment. HOME CARE INSTRUCTIONS  Monitor your abdominal pain for any changes. The following actions may help to alleviate any discomfort you are experiencing:  Only take over-the-counter or prescription medicines as directed by your health care provider.  Do not take laxatives unless directed to do so by your health care provider.  Try a clear liquid diet (broth, tea, or water) as directed by your health care provider. Slowly move to a bland diet as tolerated. SEEK MEDICAL CARE IF:  You have unexplained abdominal pain.  You have abdominal pain associated with nausea or diarrhea.  You have pain when you urinate or have a bowel movement.  You experience abdominal pain that wakes you in the night.  You have abdominal pain that is worsened or improved by eating food.  You have abdominal pain that is worsened with eating fatty foods.  You have a fever. SEEK  IMMEDIATE MEDICAL CARE IF:   Your pain does not go away within 2 hours.  You keep throwing up (vomiting).  Your pain is felt only in portions of the abdomen, such as the right side or the left lower portion of the abdomen.  You pass bloody or black tarry stools. MAKE SURE YOU:  Understand these instructions.   Will watch your condition.   Will get help right away if you are not doing well or get worse.  Document Released: 05/05/2005 Document Revised: 07/31/2013 Document Reviewed: 04/04/2013 [employer] Patient Information 2015 [place], [place]. This information is not intended to replace advice given to you by your health care provider. Make sure you discuss any questions you have with your health care provider.

## 2015-03-28 NOTE — ED Provider Notes (Signed)
[facility] Emergency Department Provider Note  ____________________________________________  Time seen: Approximately 8:28 AM  I have reviewed the triage vital signs and the nursing notes.   HISTORY  Chief Complaint Abdominal Pain and Nausea    HPI Lindsey Park is a 54 y.o. female reports that she's been having abdominal discomfort and feeling like her abdomen is slightly swollen for approximately the last week. She is been seeing her primary care doctor recently has been told that her liver function tests of  abnormal and has been referred, but has not yet seenat [facility].  She denies any fevers or chills. No nausea or vomiting. No chest pain or trouble breathing. She just reports an aching and off-and-on discomfort in the abdomen, and feeling slightly bloated.  No history of drug abuse. No heavy alcohol use. No history of hepatitis. He is not throwing up. She is notyellowing of her skin or eyes. No trouble urinating.   Past Medical History  Diagnosis Date  . Hypertension   . Cancer     There are no active problems to display for this patient.   Past Surgical History  Procedure Laterality Date  . Rectal surgery      Current Outpatient Rx  Name  Route  Sig  Dispense  Refill  . atenolol (TENORMIN) 50 MG tablet   Oral   Take 50 mg by mouth daily.         . hydrochlorothiazide (HYDRODIURIL) 25 MG tablet   Oral   Take 25 mg by mouth daily.         . pantoprazole (PROTONIX) 20 MG tablet   Oral   Take 1 tablet (20 mg total) by mouth daily.   30 tablet   1   . traZODone (DESYREL) 100 MG tablet   Oral   Take 100 mg by mouth at bedtime as needed for sleep.         . potassium chloride (K-DUR) 10 MEQ tablet   Oral   Take 1 tablet (10 mEq total) by mouth daily.   5 tablet   0     Allergies Sulfa antibiotics  No family history on file.  Social History Social History  Substance Use Topics  . Smoking status: Current Every Day  Smoker -- 0.50 packs/day for 20 years    Types: Cigarettes  . Smokeless tobacco: Never Used  . Alcohol Use: 2.4 oz/week    4 Cans of beer per week    Review of Systems Constitutional: No fever/chills Eyes: No visual changes. ENT: No sore throat. Cardiovascular: Denies chest pain. Respiratory: Denies shortness of breath. Gastrointestinal: See history of present illness No nausea, no vomiting.  No diarrhea.  No constipation. Genitourinary: Negative for dysuria. Musculoskeletal: Negative for back pain. Skin: Negative for rash. Neurological: Negative for headaches, focal weakness or numbness.  10-point ROS otherwise negative.  ____________________________________________   PHYSICAL EXAM:  VITAL SIGNS: ED Triage Vitals  Enc Vitals Group     BP --      Pulse --      Resp --      Temp --      Temp src --      SpO2 --      Weight --      Height --      Head Cir --      Peak Flow --      Pain Score 03/28/15 0725 8     Pain Loc --  Pain Edu? --      Excl. in [place]? --     Constitutional: Alert and oriented. Well appearing and in no acute distress. Eyes: Conjunctivae are normal. PERRL. EOMI. Head: Atraumatic. Nose: No congestion/rhinnorhea. Mouth/Throat: Mucous membranes are moist.  Oropharynx non-erythematous. Neck: No stridor.   Cardiovascular: Normal rate, regular rhythm. Grossly normal heart sounds.  Good peripheral circulation. Respiratory: Normal respiratory effort.  No retractions. Lungs CTAB. Gastrointestinal: Soft and nontender except for some minimal discomfort in the right upper quadrant without rebound or guarding. Negative Murphy. No peritoneal signs.. No distention. No abdominal bruits. No CVA tenderness. Musculoskeletal: No lower extremity tenderness nor edema.  No joint effusions. Neurologic:  Normal speech and language. No gross focal neurologic deficits are appreciated. No gait instability. Skin:  Skin is warm, dry and intact. No rash  noted. Psychiatric: Mood and affect are normal. Speech and behavior are normal.  ____________________________________________   LABS (all labs ordered are listed, but only abnormal results are displayed)  Labs Reviewed  COMPREHENSIVE METABOLIC PANEL - Abnormal; Notable for the following:    Potassium 3.1 (*)    Chloride 98 (*)    Glucose, Bld 112 (*)    AST 144 (*)    ALT 122 (*)    All other components within normal limits  CBC - Abnormal; Notable for the following:    Platelets 109 (*)    All other components within normal limits  URINALYSIS COMPLETEWITH MICROSCOPIC ([facility] ONLY) - Abnormal; Notable for the following:    Color, Urine YELLOW (*)    APPearance CLEAR (*)    Specific Gravity, Urine 1.004 (*)    Squamous Epithelial / LPF 0-5 (*)    All other components within normal limits  LIPASE, BLOOD  HCG, QUANTITATIVE, PREGNANCY  HEPATITIS PANEL, ACUTE  ANTINUCLEAR ANTIBODIES, IFA   ____________________________________________  EKG  ED ECG REPORT I, Rand Etchison, the attending physician, personally viewed and interpreted this ECG.  Date: 03/28/2015 EKG Time: 750 Rate: 65 Rhythm: normal sinus rhythm QRS Axis: normal Intervals: normal ST/T Wave abnormalities: normal Conduction Disutrbances: none Narrative Interpretation: unremarkable  ____________________________________________  RADIOLOGY   IMPRESSION: 1. No acute abdominal/pelvic findings, mass lesions or adenopathy. 2. Status posthysterectomy. 3. Moderate atherosclerotic calcifications involving the aorta but no aneurysm or dissection. ____________________________________________   PROCEDURES  Procedure(s) performed: None  Critical Care performed: No  ____________________________________________   INITIAL IMPRESSION / ASSESSMENT AND PLAN / ED COURSE  Pertinent labs & imaging results that were available during my care of the patient were reviewed by me and considered in my medical decision making  (see chart for details).  Patient presents with 1-2 weeks of upper abdominal discomfort and a feeling of slightly bloated. She has no systemic symptoms. Afebrile with a very reassuring exam with some slight right upper quadrant tenderness. Because of a slight transaminitis, we will obtain CT imaging to evaluate for secondary signs of cirrhosis, cholecystitis, biliary disease, or other right upper quadrant etiologies. The remainder of exam is essentially normal. No acute cardiopulmonary symptoms.  ----------------------------------------- 10:38 AM on 03/28/2015 -----------------------------------------  Patient feeling improved, she is ambulatory. No distress. I discussed with Dr. Vira Agar of our gastroenterology service, he suggested adding a hepatitis panel and a N/A. I have done this, he is happy to see the patient in clinic or she can follow up with [facility] gastroenterology as hours scheduled. However for her back to her primary care doctor for ongoing workup and follow-up care.  I discussed the patient return precautions, she'll  return to emergency room away if she develops any chest pain or trouble breathing, she has any fevers or chills, nausea or vomiting, develops yellowing of the skin, has severe abdominal pain, or other new concerns or symptoms arise.  Patient agrees not to drive for the next 6 hours as she was given morphine in the emergency room. ____________________________________________   FINAL CLINICAL IMPRESSION(S) / ED DIAGNOSES  Final diagnoses:  Transaminitis  Upper abdominal pain  Hypokalemia      Delman Kitten, MD 03/28/15 1043

## 2015-03-28 NOTE — ED Notes (Signed)
abd pain and swelling since last Saturday along with some nausea. Recent weight loss over the past two mths.

## 2015-03-31 LAB — HEPATITIS PANEL, ACUTE
Hep A IgM: NEGATIVE
Hep B C IgM: NEGATIVE
Hepatitis B Surface Ag: NEGATIVE

## 2015-03-31 LAB — ANTINUCLEAR ANTIBODIES, IFA: ANA Ab, IFA: NEGATIVE

## 2016-01-14 DIAGNOSIS — K7460 Unspecified cirrhosis of liver: Secondary | ICD-10-CM | POA: Insufficient documentation

## 2016-03-01 ENCOUNTER — Emergency Department
Admission: EM | Admit: 2016-03-01 | Discharge: 2016-03-01 | Disposition: A | Discharge status: Home / Self Care | Payer: Self-pay | Attending: Emergency Medicine | Admitting: Emergency Medicine

## 2016-03-01 ENCOUNTER — Emergency Department: Payer: Self-pay

## 2016-03-01 ENCOUNTER — Encounter: Payer: Self-pay | Admitting: Emergency Medicine

## 2016-03-01 DIAGNOSIS — W010XXA Fall on same level from slipping, tripping and stumbling without subsequent striking against object, initial encounter: Secondary | ICD-10-CM | POA: Insufficient documentation

## 2016-03-01 DIAGNOSIS — I10 Essential (primary) hypertension: Secondary | ICD-10-CM | POA: Insufficient documentation

## 2016-03-01 DIAGNOSIS — S20212A Contusion of left front wall of thorax, initial encounter: Secondary | ICD-10-CM | POA: Insufficient documentation

## 2016-03-01 DIAGNOSIS — Y929 Unspecified place or not applicable: Secondary | ICD-10-CM | POA: Insufficient documentation

## 2016-03-01 DIAGNOSIS — F17210 Nicotine dependence, cigarettes, uncomplicated: Secondary | ICD-10-CM | POA: Insufficient documentation

## 2016-03-01 DIAGNOSIS — Y939 Activity, unspecified: Secondary | ICD-10-CM | POA: Insufficient documentation

## 2016-03-01 DIAGNOSIS — Y999 Unspecified external cause status: Secondary | ICD-10-CM | POA: Insufficient documentation

## 2016-03-01 NOTE — ED Triage Notes (Signed)
Patient presents to the ED with left rib pain post fall 9 days ago.  Patient states it is painful to laugh and breathe deeply.  Patient ambulatory to triage without obvious difficulty.

## 2016-03-01 NOTE — ED Notes (Signed)
Upon entering patients room to review discharge instructions patient is not in room. Per Olivia Mackie, NT "She just walked out". Dr. Reita Cliche notified and aware. Dr. Reita Cliche spoke with patient in regards to discharge before patient left. Discharge pain assessment, vital signs, and discharge e signature unable to be obtained. This RN did not review discharge instructions.

## 2016-03-01 NOTE — Discharge Instructions (Signed)
You were evaluated for continuing left rib pain after a fall, and your x-ray is reassuring, although as we discussed I suspect he may have a hairline fracture. You may increase your ibuprofen from 1 tablet each 24-hour to 2 tablets every 6-8 hours throughout the day for pain control.  Return to the emergency department for any worsening condition including chest pain, trouble breathing, dizziness or passing out, palpitations, or any other symptoms concerning to you.

## 2016-03-01 NOTE — ED Provider Notes (Signed)
[facility] Emergency Department Provider Note   ____________________________________________  Time seen: Approximately 4:25 PM I have reviewed the triage vital signs and the triage nursing note.  HISTORY  Chief Complaint Fall   Historian Patient  HPI Lindsey Park is a 55 y.o. female who fell about a week and a half ago as she stumbled out her door and landed onto her left side, experiencing bruising to the arm as well as the left chest wall. She thought she just had bruising and so she waited and has been taking only 200 mg of ibuprofen once per day because she was told she has a "liver condition. "She is continued to have some discomfort especially with palpation. 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  She wanted to make sure that she didn't have a rib fracture which is why she came in today because the pain has not gone away after the week and half.  Pain is moderate. Movement and palpation make it worse.    Past Medical History:  Diagnosis Date  . Cancer ([place])   . Hypertension     There are no active problems to display for this patient.   Past Surgical History:  Procedure Laterality Date  . RECTAL SURGERY      Current Outpatient Rx  . Order #: [id number] Class: Historical Med  . Order #: [id number] Class: Historical Med  . Order #: [id number] Class: Print  . Order #: [id number] Class: Print  . Order #: [id number] Class: Historical Med  Atenolol, hydrocodone, Protonix, Drucilla Chalet, trazodone  Allergies Sulfa antibiotics  No family history on file.  Social History Social History  Substance Use Topics  . Smoking status: Current Every Day Smoker    Packs/day: 0.50    Years: 20.00    Types: Cigarettes  . Smokeless tobacco: Never Used  . Alcohol use 2.4 oz/week    4 Cans of beer per week    Review of Systems  Constitutional: Negative for fever. Eyes: Negative for visual changes. ENT: Negative for sore  throat. Cardiovascular: Negative for Any central chest pain or palpitations. Respiratory: Negative for shortness of breath. Feels limited in breathing due to the pain on the left side of the chest wall when she takes deep breaths. Gastrointestinal: Negative for abdominal pain, vomiting and diarrhea. Genitourinary: Negative for dysuria. Musculoskeletal: Negative for back pain. Skin: Negative for rash. Neurological: Negative for headache. 10 point Review of Systems otherwise negative ____________________________________________   PHYSICAL EXAM:  VITAL SIGNS: ED Triage Vitals  Enc Vitals Group     BP 03/01/16 1345 (!) 149/88     Pulse Rate 03/01/16 1345 78     Resp 03/01/16 1530 17     Temp 03/01/16 1345 97.7 F (36.5 C)     Temp Source 03/01/16 1345 Oral     SpO2 03/01/16 1345 98 %     Weight 03/01/16 1342 123 lb (55.8 kg)     Height 03/01/16 1342 5\' 7"  (1.702 m)     Head Circumference --      Peak Flow --      Pain Score 03/01/16 1343 10     Pain Loc --      Pain Edu? --      Excl. in [place]? --      Constitutional: Alert and oriented. Well appearing and in no distress. HEENT   Head: Normocephalic and atraumatic.      Eyes: Conjunctivae are normal. PERRL. Normal extraocular movements.  Ears:         Nose: No congestion/rhinnorhea.   Mouth/Throat: Mucous membranes are moist.   Neck: No stridor. Cardiovascular/Chest: Normal rate, regular rhythm.  No murmurs, rubs, or gallops.  Bruising/ecchymosis older to the lateral left chest wall, tender to point palpation at the lateral lower rib margin on the left side. Respiratory: Normal respiratory effort without tachypnea nor retractions. Breath sounds are clear and equal bilaterally. No wheezes/rales/rhonchi. Gastrointestinal: Soft. No distention, no guarding, no rebound. Nontender.    Genitourinary/rectal:Deferred Musculoskeletal: Appearing bruising to the right forearm. Nontender with normal range of motion in all  extremities. No joint effusions.  No lower extremity tenderness.  No edema. Neurologic:  Normal speech and language. No gross or focal neurologic deficits are appreciated. Skin:  Skin is warm, dry and intact. No rash noted. Psychiatric: Mood and affect are normal. Speech and behavior are normal. Patient exhibits appropriate insight and judgment.  ____________________________________________   EKG I, Lisa Roca, MD, the attending physician have personally viewed and interpreted all ECGs.  None ____________________________________________  LABS (pertinent positives/negatives)  Labs Reviewed - No data to display  ____________________________________________  RADIOLOGY All Xrays were viewed by me. Imaging interpreted by Radiologist.  Chest two-view: No edema or consolidation. __________________________________________  PROCEDURES  Procedure(s) performed: None  Critical Care performed: None  ____________________________________________   ED COURSE / ASSESSMENT AND PLAN  Pertinent labs & imaging results that were available during my care of the patient were reviewed by me and considered in my medical decision making (see chart for details).    This patient came in requesting an x-ray to make sure she didn't have any rib fractures given that she still having point tenderness to the left of the chest wall where she fell about a week ago and has still appearing ecchymosis and point tenderness to palpation. She's not hypoxic. She is having no central chest pain not concerned about ACS. Not concerned about PE.  I do think that she probably does have underlying clinical rib fracture although the x-ray are clear.  Patient has been taking 200 mg of ibuprofen once per day, I indicated to her that she could go ahead and increase this to at least 400 mg every 8 hours for better control. We discussed deep breathing. She does have a primary care physician to follow up  with.    CONSULTATIONS: None   Patient / Family / Caregiver informed of clinical course, medical decision-making process, and agree with plan.   I discussed return precautions, follow-up instructions, and discharged instructions with patient and/or family.   ___________________________________________   FINAL CLINICAL IMPRESSION(S) / ED DIAGNOSES   Final diagnoses:  Rib contusion, left, initial encounter              Note: This dictation was prepared with Dragon dictation. Any transcriptional errors that result from this process are unintentional    Lisa Roca, MD 03/01/16 1901

## 2016-03-01 NOTE — ED Notes (Signed)
Patient states she fell 1 week ago. Patient states she waited to come in because she thought they were "just bruised". Patient guarding area. Denies LOC or hitting head. Patient is not on blood thinners.

## 2016-03-01 NOTE — ED Notes (Signed)
MD at bedside. 

## 2016-03-01 NOTE — ED Notes (Signed)
Pt given cup of water 

## 2017-10-19 ENCOUNTER — Encounter: Payer: Self-pay | Admitting: Emergency Medicine

## 2017-10-19 ENCOUNTER — Emergency Department: Payer: Self-pay

## 2017-10-19 ENCOUNTER — Emergency Department
Admission: EM | Admit: 2017-10-19 | Discharge: 2017-10-19 | Disposition: A | Discharge status: Home / Self Care | Payer: Self-pay | Attending: Emergency Medicine | Admitting: Emergency Medicine

## 2017-10-19 DIAGNOSIS — Y929 Unspecified place or not applicable: Secondary | ICD-10-CM | POA: Insufficient documentation

## 2017-10-19 DIAGNOSIS — Y939 Activity, unspecified: Secondary | ICD-10-CM | POA: Insufficient documentation

## 2017-10-19 DIAGNOSIS — S8392XA Sprain of unspecified site of left knee, initial encounter: Secondary | ICD-10-CM | POA: Insufficient documentation

## 2017-10-19 DIAGNOSIS — Y990 Civilian activity done for income or pay: Secondary | ICD-10-CM | POA: Insufficient documentation

## 2017-10-19 DIAGNOSIS — W19XXXA Unspecified fall, initial encounter: Secondary | ICD-10-CM | POA: Insufficient documentation

## 2017-10-19 DIAGNOSIS — Z79899 Other long term (current) drug therapy: Secondary | ICD-10-CM | POA: Insufficient documentation

## 2017-10-19 DIAGNOSIS — Z859 Personal history of malignant neoplasm, unspecified: Secondary | ICD-10-CM | POA: Insufficient documentation

## 2017-10-19 DIAGNOSIS — I10 Essential (primary) hypertension: Secondary | ICD-10-CM | POA: Insufficient documentation

## 2017-10-19 DIAGNOSIS — F17210 Nicotine dependence, cigarettes, uncomplicated: Secondary | ICD-10-CM | POA: Insufficient documentation

## 2017-10-19 MED ORDER — IBUPROFEN 600 MG PO TABS: 600.0000 mg | ORAL_TABLET | Freq: Three times a day (TID) | ORAL | 0 refills | Status: DC | PRN

## 2017-10-19 MED ORDER — HYDROCODONE-ACETAMINOPHEN 5-325 MG PO TABS: 1.0000 | ORAL_TABLET | Freq: Four times a day (QID) | ORAL | 0 refills | Status: DC | PRN

## 2017-10-19 MED ORDER — HYDROCODONE-ACETAMINOPHEN 5-325 MG PO TABS
1.0000 | ORAL_TABLET | Freq: Once | ORAL | Status: AC
  Administered 2017-10-19: 1 via ORAL
  Filled 2017-10-19: qty 1

## 2017-10-19 NOTE — Discharge Instructions (Signed)
Follow-up with your primary care provider or Dr. Marry Guan if any continued problems with your knee.  Wear knee immobilizer when up walking.  You do not need to wear it when in bed.  Begin taking ibuprofen 600 mg 3 times daily with food.  Norco if needed for moderate to severe pain.  You cannot drive or operate machinery while taking the Norco.  Ice and elevate your leg as needed for swelling and pain.

## 2017-10-19 NOTE — ED Triage Notes (Signed)
Pt states she was walking to a table at work, and her leg gave out and caused her to fall, denies syncope, tearful in triage, states her left knee hit the floor first, c/o tingling in her left leg at this time post fall.

## 2017-10-19 NOTE — ED Provider Notes (Signed)
[facility] Emergency Department Provider Note   ____________________________________________   First MD Initiated Contact with Patient 10/19/17 1032     (approximate)  I have reviewed the triage vital signs and the nursing notes.   HISTORY  Chief Complaint Knee Pain   HPI Lindsey Park is a 57 y.o. female is here with complaint of left knee pain.  Patient states that she was walking to a work table when her knee gave out on her causing her to fall.  She complains of "tingling" in her left leg at this time.  She is brought to the ED by EMS.  She denies any head injury or loss of consciousness.  Patient states that she has hurt the same knee in the past but did not require surgery.  She rates her pain as a 10/10.   Past Medical History:  Diagnosis Date  . Cancer ([place])   . Hypertension     There are no active problems to display for this patient.   Past Surgical History:  Procedure Laterality Date  . RECTAL SURGERY      Prior to Admission medications   Medication Sig Start Date End Date Taking? Authorizing Provider  atenolol (TENORMIN) 50 MG tablet Take 50 mg by mouth daily.    [provider]  hydrochlorothiazide (HYDRODIURIL) 25 MG tablet Take 25 mg by mouth daily.    [provider]  HYDROcodone-acetaminophen (NORCO/VICODIN) 5-325 MG tablet Take 1 tablet by mouth every 6 (six) hours as needed for moderate pain. 10/19/17   Johnn Hai, PA-C  ibuprofen (ADVIL,MOTRIN) 600 MG tablet Take 1 tablet (600 mg total) by mouth every 8 (eight) hours as needed. 10/19/17   Johnn Hai, PA-C  pantoprazole (PROTONIX) 20 MG tablet Take 1 tablet (20 mg total) by mouth daily. 01/17/15 01/17/16  Delman Kitten, MD  potassium chloride (K-DUR) 10 MEQ tablet Take 1 tablet (10 mEq total) by mouth daily. 03/28/15   Delman Kitten, MD  traZODone (DESYREL) 100 MG tablet Take 100 mg by mouth at bedtime as needed for sleep.    [provider]     Allergies Sulfa antibiotics  No family history on file.  Social History Social History   Tobacco Use  . Smoking status: Current Every Day Smoker    Packs/day: 0.50    Years: 20.00    Pack years: 10.00    Types: Cigarettes  . Smokeless tobacco: Never Used  Substance Use Topics  . Alcohol use: Yes    Alcohol/week: 2.4 oz    Types: 4 Cans of beer per week  . Drug use: No    Review of Systems Constitutional: No fever/chills Cardiovascular: Denies chest pain. Respiratory: Denies shortness of breath. Gastrointestinal: No abdominal pain.  No nausea, no vomiting.  Musculoskeletal: Positive for left knee pain. Skin: Negative for rash. Neurological: Negative for headaches.  ____________________________________________   PHYSICAL EXAM:  VITAL SIGNS: ED Triage Vitals  Enc Vitals Group     BP 10/19/17 1025 (!) 173/78     Pulse Rate 10/19/17 1025 71     Resp 10/19/17 1025 18     Temp 10/19/17 1025 97.9 F (36.6 C)     Temp Source 10/19/17 1025 Oral     SpO2 10/19/17 1025 98 %     Weight 10/19/17 1026 127 lb (57.6 kg)     Height 10/19/17 1026 5\' 7"  (1.702 m)     Head Circumference --      Peak Flow --  Pain Score 10/19/17 1025 10     Pain Loc --      Pain Edu? --      Excl. in [place]? --    Constitutional: Alert and oriented. Well appearing and in no acute distress. Eyes: Conjunctivae are normal.  Head: Atraumatic. Neck: No stridor.   Cardiovascular: Normal rate, regular rhythm. Grossly normal heart sounds.  Good peripheral circulation. Respiratory: Normal respiratory effort.  No retractions. Lungs CTAB. Musculoskeletal: On examination of the left knee there is no gross deformity and no obvious effusion present.  Patient is markedly tender to palpation to the patella and anterior portion of the knee.  Range of motion is restricted secondary to patient's pain.  Patient was able to stand secondary to pain.  Secondary to pain patient's ligaments were not tested.  She  was given Norco while in the emergency department.  Pain was intact.  No ecchymosis or abrasions were seen. Neurologic:  Normal speech and language. No gross focal neurologic deficits are appreciated.  Skin:  Skin is warm, dry and intact. No rash noted.  Psychiatric: Mood and affect are normal. Speech and behavior are normal.  ____________________________________________   LABS (all labs ordered are listed, but only abnormal results are displayed)  Labs Reviewed - No data to display  RADIOLOGY  ED MD interpretation:  Left knee x-ray is negative for acute fracture.  Official radiology report(s): Dg Knee Complete 4 Views Left  Result Date: 10/19/2017 CLINICAL DATA:  Pt reports slipping and falling at work, where her left leg bent under her and she fell on it. Pt states she has pain all over the knee and is unable to externally rotate. EXAM: LEFT KNEE - COMPLETE 4+ VIEW COMPARISON:  06/22/2012 FINDINGS: No evidence of fracture, dislocation, or joint effusion. No evidence of arthropathy or other focal bone abnormality. Soft tissues are unremarkable. IMPRESSION: Negative. Electronically Signed   By: Lucrezia Europe M.D.   On: 10/19/2017 11:58    ____________________________________________   PROCEDURES  Procedure(s) performed:   .Splint Application Date/Time: 9/73/5329 6:03 PM Performed by: Harlen Labs, NT Authorized by: Johnn Hai, PA-C   Consent:    Consent obtained:  Verbal   Consent given by:  Patient   Risks discussed:  Pain   Alternatives discussed:  Referral Pre-procedure details:    Sensation:  Normal Procedure details:    Laterality:  Left   Location:  Knee   Knee:  L knee   Strapping: yes     Splint type:  Knee immobilizer Post-procedure details:    Pain:  Unchanged   Sensation:  Normal   Patient tolerance of procedure:  Tolerated well, no immediate complications    Critical Care performed:  No  ____________________________________________   INITIAL IMPRESSION / ASSESSMENT AND PLAN / ED COURSE  As part of my medical decision making, I reviewed the following data within the electronic MEDICAL RECORD NUMBER Notes from prior ED visits and  Controlled Substance Database  Patient was placed in a knee immobilizer and instructed to ice and elevate her knee today.  She is to follow-up with Dr. Marry Guan if any continued problems with her knee.  She was given a prescription for Norco 1 every 6 hours as needed for pain and ibuprofen 600 mg every 8 hours with food.  Patient is instructed to use knee immobilizer when up walking.    ____________________________________________   FINAL CLINICAL IMPRESSION(S) / ED DIAGNOSES  Final diagnoses:  Sprain of left knee, unspecified ligament, initial  encounter     ED Discharge Orders        Ordered    HYDROcodone-acetaminophen (NORCO/VICODIN) 5-325 MG tablet  Every 6 hours PRN     10/19/17 1216    ibuprofen (ADVIL,MOTRIN) 600 MG tablet  Every 8 hours PRN     10/19/17 1216       Note:  This document was prepared using Dragon voice recognition software and may include unintentional dictation errors.    Johnn Hai, PA-C 10/19/17 [place], [place], MD 10/20/17 [phone]

## 2018-01-10 DIAGNOSIS — F418 Other specified anxiety disorders: Secondary | ICD-10-CM | POA: Insufficient documentation

## 2018-01-10 DIAGNOSIS — Z636 Dependent relative needing care at home: Secondary | ICD-10-CM | POA: Insufficient documentation

## 2018-09-16 ENCOUNTER — Other Ambulatory Visit: Payer: Self-pay

## 2018-09-16 ENCOUNTER — Encounter: Payer: Self-pay | Admitting: Emergency Medicine

## 2018-09-16 ENCOUNTER — Emergency Department: Payer: Self-pay

## 2018-09-16 ENCOUNTER — Emergency Department
Admission: EM | Admit: 2018-09-16 | Discharge: 2018-09-16 | Disposition: A | Discharge status: Home / Self Care | Payer: Self-pay | Attending: Emergency Medicine | Admitting: Emergency Medicine

## 2018-09-16 DIAGNOSIS — Y998 Other external cause status: Secondary | ICD-10-CM | POA: Insufficient documentation

## 2018-09-16 DIAGNOSIS — S39012A Strain of muscle, fascia and tendon of lower back, initial encounter: Secondary | ICD-10-CM | POA: Insufficient documentation

## 2018-09-16 DIAGNOSIS — I10 Essential (primary) hypertension: Secondary | ICD-10-CM | POA: Insufficient documentation

## 2018-09-16 DIAGNOSIS — X500XXA Overexertion from strenuous movement or load, initial encounter: Secondary | ICD-10-CM | POA: Insufficient documentation

## 2018-09-16 DIAGNOSIS — Z79899 Other long term (current) drug therapy: Secondary | ICD-10-CM | POA: Insufficient documentation

## 2018-09-16 DIAGNOSIS — Y92009 Unspecified place in unspecified non-institutional (private) residence as the place of occurrence of the external cause: Secondary | ICD-10-CM | POA: Insufficient documentation

## 2018-09-16 DIAGNOSIS — Y9389 Activity, other specified: Secondary | ICD-10-CM | POA: Insufficient documentation

## 2018-09-16 DIAGNOSIS — F17210 Nicotine dependence, cigarettes, uncomplicated: Secondary | ICD-10-CM | POA: Insufficient documentation

## 2018-09-16 MED ORDER — KETOROLAC TROMETHAMINE 30 MG/ML IJ SOLN
30.0000 mg | Freq: Once | INTRAMUSCULAR | Status: AC
  Administered 2018-09-16: 30 mg via INTRAMUSCULAR
  Filled 2018-09-16: qty 1

## 2018-09-16 MED ORDER — CYCLOBENZAPRINE HCL 5 MG PO TABS: 5.0000 mg | ORAL_TABLET | Freq: Three times a day (TID) | ORAL | 0 refills | Status: DC | PRN

## 2018-09-16 MED ORDER — ORPHENADRINE CITRATE 30 MG/ML IJ SOLN
60.0000 mg | INTRAMUSCULAR | Status: AC
  Administered 2018-09-16: 60 mg via INTRAMUSCULAR
  Filled 2018-09-16: qty 2

## 2018-09-16 MED ORDER — KETOROLAC TROMETHAMINE 10 MG PO TABS: 10.0000 mg | ORAL_TABLET | Freq: Three times a day (TID) | ORAL | 0 refills | Status: DC

## 2018-09-16 NOTE — ED Notes (Signed)
Pt verbalized understanding of discharge instructions. NAD at this time. 

## 2018-09-16 NOTE — Discharge Instructions (Addendum)
Your exam and XR are negative for fracture or dislocation. Take the prescription meds as directed. Follow-up with Dr. Lennox Grumbles or return as needed.

## 2018-09-16 NOTE — ED Triage Notes (Signed)
Low back pain x 2 weeks, began when bending over.

## 2018-09-16 NOTE — ED Provider Notes (Signed)
[facility] Emergency Department Provider Note ____________________________________________  Time seen: 1036  I have reviewed the triage vital signs and the nursing notes.  HISTORY  Chief Complaint  Back Pain  HPI Lindsey Park is a 58 y.o. female presents herself to the ED for evaluation of 2-week complaint of intermittent and worsening low back pain.  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  She describes initially she had sudden pain to the low back as she bent over to move a rug, while she was vacuuming.  She reports shortly after that she had to help pick up her husband who had slipped to the ground, further aggravated her low back pain.  She denies any outright fall, bladder or bowel incontinence, foot drop, or saddle anesthesias.  She has also denied any distal paresthesias.  She has been taking Tylenol or Motrin in the interim and applying ice and heat with limited benefit.  She denies a history of chronic or ongoing low back pain.  Past Medical History:  Diagnosis Date  . Cancer ([place])   . Hypertension     There are no active problems to display for this patient.   Past Surgical History:  Procedure Laterality Date  . RECTAL SURGERY      Prior to Admission medications   Medication Sig Start Date End Date Taking? Authorizing Provider  atenolol (TENORMIN) 50 MG tablet Take 50 mg by mouth daily.    [provider]  cyclobenzaprine (FLEXERIL) 5 MG tablet Take 1 tablet (5 mg total) by mouth 3 (three) times daily as needed for muscle spasms. 09/16/18   Nikash Mortensen, Dannielle Karvonen, PA-C  hydrochlorothiazide (HYDRODIURIL) 25 MG tablet Take 25 mg by mouth daily.    [provider]  ketorolac (TORADOL) 10 MG tablet Take 1 tablet (10 mg total) by mouth every 8 (eight) hours. 09/16/18   Remell Giaimo, Dannielle Karvonen, PA-C    Allergies Sulfa antibiotics  No family history on  file.  Social History Social History   Tobacco Use  . Smoking status: Current Every Day Smoker    Packs/day: 0.50    Years: 20.00    Pack years: 10.00    Types: Cigarettes  . Smokeless tobacco: Never Used  Substance Use Topics  . Alcohol use: Yes    Alcohol/week: 4.0 standard drinks    Types: 4 Cans of beer per week  . Drug use: No    Review of Systems  Constitutional: Negative for fever. Cardiovascular: Negative for chest pain. Respiratory: Negative for shortness of breath. Gastrointestinal: Negative for abdominal pain, vomiting and diarrhea. Genitourinary: Negative for dysuria. Musculoskeletal: Positive for back pain. Skin: Negative for rash. Neurological: Negative for headaches, focal weakness or numbness. ____________________________________________  PHYSICAL EXAM:  VITAL SIGNS: ED Triage Vitals  Enc Vitals Group     BP 09/16/18 0858 (!) 191/89     Pulse Rate 09/16/18 0858 74     Resp 09/16/18 0858 (!) 22     Temp 09/16/18 0858 97.8 F (36.6 C)     Temp Source 09/16/18 0858 Oral     SpO2 09/16/18 0858 99 %     Weight 09/16/18 0859 128 lb (58.1 kg)     Height 09/16/18 0859 5\' 7"  (1.702 m)     Head Circumference --      Peak Flow --      Pain Score 09/16/18 0859 10  Pain Loc --      Pain Edu? --      Excl. in [place]? --     Constitutional: Alert and oriented. Well appearing and in no distress. Head: Normocephalic and atraumatic. Eyes: Conjunctivae are normal. Normal extraocular movements Cardiovascular: Normal rate, regular rhythm. Normal distal pulses. Respiratory: Normal respiratory effort. No wheezes/rales/rhonchi. Gastrointestinal: Soft and nontender. No distention. Musculoskeletal: Normal spinal alignment without midline tenderness, spasm, deformity, or step-off.  Patient transitions from supine to sit without assistance.  Negative seated straight leg raise bilaterally.  Nontender with normal range of motion in all extremities.  Neurologic: Cranial  nerves II through XII grossly intact.  Normal LE DTRs bilaterally.  Normal toe dorsiflexion foot eversion noted.  Mildly antalgic gait without ataxia. Normal speech and language. No gross focal neurologic deficits are appreciated. Skin:  Skin is warm, dry and intact. No rash noted. ____________________________________________   RADIOLOGY  Lumbar Spine IMPRESSION: Mild degenerative changes. No evidence for acute abnormality. ____________________________________________  PROCEDURES  Procedures Toradol 30 mg IM Norflex 60 mg IM ____________________________________________  INITIAL IMPRESSION / ASSESSMENT AND PLAN / ED COURSE  Patient with ED evaluation of a 2-week complaint of intermittent and progressive low back pain without distal paresthesias.  Patient's clinical picture is concerning for a lumbar strain.  Her exam is reassuring as it shows no neuromuscular deficit or incontinence.  X-ray also shows some mild degenerative changes without acute fracture.  Patient has responded to IM medications provided in the ED.  Her symptoms are consistent with a lumbar strain.  She will be discharged with prescriptions for Flexeril and Toradol to take as directed.  She will follow with primary provider or return to the ED as needed. ____________________________________________  FINAL CLINICAL IMPRESSION(S) / ED DIAGNOSES  Final diagnoses:  Strain of lumbar region, initial encounter      Melvenia Needles, PA-C 09/16/18 1832    Earleen Newport, MD 09/17/18 [phone]

## 2019-05-09 ENCOUNTER — Other Ambulatory Visit: Payer: Self-pay

## 2019-05-09 ENCOUNTER — Encounter: Payer: Self-pay | Admitting: Emergency Medicine

## 2019-05-09 ENCOUNTER — Emergency Department
Admission: EM | Admit: 2019-05-09 | Discharge: 2019-05-09 | Disposition: A | Discharge status: Home / Self Care | Payer: Self-pay | Attending: Emergency Medicine | Admitting: Emergency Medicine

## 2019-05-09 ENCOUNTER — Emergency Department: Payer: Self-pay

## 2019-05-09 DIAGNOSIS — Y9301 Activity, walking, marching and hiking: Secondary | ICD-10-CM | POA: Insufficient documentation

## 2019-05-09 DIAGNOSIS — W01198A Fall on same level from slipping, tripping and stumbling with subsequent striking against other object, initial encounter: Secondary | ICD-10-CM | POA: Insufficient documentation

## 2019-05-09 DIAGNOSIS — Z859 Personal history of malignant neoplasm, unspecified: Secondary | ICD-10-CM | POA: Insufficient documentation

## 2019-05-09 DIAGNOSIS — Y998 Other external cause status: Secondary | ICD-10-CM | POA: Insufficient documentation

## 2019-05-09 DIAGNOSIS — Y92017 Garden or yard in single-family (private) house as the place of occurrence of the external cause: Secondary | ICD-10-CM | POA: Insufficient documentation

## 2019-05-09 DIAGNOSIS — I10 Essential (primary) hypertension: Secondary | ICD-10-CM | POA: Insufficient documentation

## 2019-05-09 DIAGNOSIS — S20211A Contusion of right front wall of thorax, initial encounter: Secondary | ICD-10-CM | POA: Insufficient documentation

## 2019-05-09 DIAGNOSIS — F17210 Nicotine dependence, cigarettes, uncomplicated: Secondary | ICD-10-CM | POA: Insufficient documentation

## 2019-05-09 MED ORDER — TRAMADOL HCL 50 MG PO TABS: 50.0000 mg | ORAL_TABLET | Freq: Four times a day (QID) | ORAL | 0 refills | Status: AC | PRN

## 2019-05-09 NOTE — Discharge Instructions (Signed)
Follow-up with your primary care provider if any continued problems.  Continue taking ibuprofen 3 tablets with food every 8 hours as needed for inflammation.  The tramadol that was sent to the pharmacy is 1 every 6 hours if needed for moderate pain however you may also take it only at bedtime as needed.  Continue using ice packs to your ribs.

## 2019-05-09 NOTE — ED Triage Notes (Addendum)
Pt in via POV, reports right side rib pain.  States she tripped over something in the yard a week ago, injuring that area.  Reports taking OTC medication without any relief.  Ambulatory to triage, NAD noted at this time.

## 2019-05-09 NOTE — ED Notes (Signed)
Pt verbalized understanding of discharge instructions. NAD at this time. 

## 2019-05-09 NOTE — ED Provider Notes (Signed)
[facility] Emergency Department Provider Note   ____________________________________________   First MD Initiated Contact with Patient 05/09/19 [phone]     (approximate)  I have reviewed the triage vital signs and the nursing notes.   HISTORY  Chief Complaint Chest Pain   HPI [facility] is a 58 y.o. female presents to the ED with complaint of right sided rib pain after she fell in her yard almost 2 weeks ago.  Patient states that she tripped over something in the yard and hit her right ribs own a piece of metal  to her water hose.  Patient is continue take over-the-counter medication without any improvement.  Patient has continued to smoke daily.  She states that her difficulty comes in trying to sleep at night as she cannot get comfortable.  She denies any difficulty breathing.  She rates her pain as 9/10.      Past Medical History:  Diagnosis Date  . Cancer ([place])   . Hypertension     There are no active problems to display for this patient.   Past Surgical History:  Procedure Laterality Date  . RECTAL SURGERY      Prior to Admission medications   Medication Sig Start Date End Date Taking? Authorizing Provider  atenolol (TENORMIN) 50 MG tablet Take 50 mg by mouth daily.    [provider]  hydrochlorothiazide (HYDRODIURIL) 25 MG tablet Take 25 mg by mouth daily.    [provider]  traMADol (ULTRAM) 50 MG tablet Take 1 tablet (50 mg total) by mouth every 6 (six) hours as needed for moderate pain. 05/09/19   Johnn Hai, PA-C    Allergies Sulfa antibiotics  No family history on file.  Social History Social History   Tobacco Use  . Smoking status: Current Every Day Smoker    Packs/day: 0.50    Years: 20.00    Pack years: 10.00    Types: Cigarettes  . Smokeless tobacco: Never Used  Substance Use Topics  . Alcohol use: Yes    Alcohol/week: 4.0 standard drinks    Types: 4 Cans of beer per week  . Drug use: No     Review of Systems Constitutional: No fever/chills Cardiovascular: Denies chest pain. Respiratory: Denies shortness of breath.  Right lateral rib pain. Gastrointestinal: No abdominal pain.  No nausea, no vomiting.  No diarrhea.  Musculoskeletal: Negative for back pain. Skin: Negative for rash. Neurological: Negative for headaches, focal weakness or numbness. ____________________________________________   PHYSICAL EXAM:  VITAL SIGNS: ED Triage Vitals  Enc Vitals Group     BP 05/09/19 0919 (!) 165/77     Pulse Rate 05/09/19 0919 74     Resp --      Temp 05/09/19 0919 97.7 F (36.5 C)     Temp src --      SpO2 05/09/19 0919 99 %     Weight 05/09/19 0921 136 lb (61.7 kg)     Height 05/09/19 0921 5\' 7"  (1.702 m)     Head Circumference --      Peak Flow --      Pain Score 05/09/19 0920 9     Pain Loc --      Pain Edu? --      Excl. in [place]? --    Constitutional: Alert and oriented. Well appearing and in no acute distress. Eyes: Conjunctivae are normal.  Head: Atraumatic. Neck: No stridor.   Cardiovascular: Normal rate, regular rhythm. Grossly normal heart sounds.  Good peripheral circulation. Respiratory: Normal respiratory effort.  No retractions. Lungs CTAB.  Right lateral ribs no gross deformity and no skin discoloration.  No abrasions were seen.  Area is moderately tender to palpation.  No soft tissue edema present. Musculoskeletal: Moves upper and lower extremities with any difficulty.  Normal gait was noted. Neurologic:  Normal speech and language. No gross focal neurologic deficits are appreciated.  Skin:  Skin is warm, dry and intact.  Psychiatric: Mood and affect are normal. Speech and behavior are normal.  ____________________________________________   LABS (all labs ordered are listed, but only abnormal results are displayed)  Labs Reviewed - No data to display  RADIOLOGY  Official radiology report(s): Dg Ribs Unilateral W/chest Right  Result Date:  05/09/2019 CLINICAL DATA:  Pain status post fall. EXAM: RIGHT RIBS AND CHEST - 3+ VIEW COMPARISON:  March 01, 2016 FINDINGS: The lungs are essentially clear. There is some prominence of the interstitial lung markings which is similar across prior studies. The heart size is stable. There is pleuroparenchymal scarring at the lung apices. There is no pneumothorax. There is no displaced rib fracture. IMPRESSION: No acute displaced fracture.  No pneumothorax. Electronically Signed   By: Constance Holster M.D.   On: 05/09/2019 10:38    ____________________________________________   PROCEDURES  Procedure(s) performed (including Critical Care):  Procedures   ____________________________________________   INITIAL IMPRESSION / ASSESSMENT AND PLAN / ED COURSE  As part of my medical decision making, I reviewed the following data within the electronic MEDICAL RECORD NUMBER Notes from prior ED visits and [place] Controlled Substance Database  58 year old female presents to the ED with complaint of continued right lateral rib pain after falling 2 weeks ago in her yard when she tripped.  No gross deformities noted.  X-rays were negative for acute bony injury.  Patient was made aware and encouraged to discontinue or decrease smoking.  She will begin applying ice to the area to help with rib pain.  She will continue taking ibuprofen 3 times a day for inflammation.  A prescription for tramadol was sent to her pharmacy.  She is to follow-up with her PCP or return to the emergency department if any worsening of her symptoms or not improving.   ____________________________________________   FINAL CLINICAL IMPRESSION(S) / ED DIAGNOSES  Final diagnoses:  Contusion of ribs, right, initial encounter     ED Discharge Orders         Ordered    traMADol (ULTRAM) 50 MG tablet  Every 6 hours PRN     05/09/19 1101           Note:  This document was prepared using Dragon voice recognition software and may include  unintentional dictation errors.    Johnn Hai, PA-C 05/09/19 1147    Carrie Mew, MD 05/09/19 [phone]

## 2019-06-22 DIAGNOSIS — L309 Dermatitis, unspecified: Secondary | ICD-10-CM | POA: Insufficient documentation

## 2019-10-04 IMAGING — DX DG KNEE COMPLETE 4+V*L*
4 series · 4 of 4 positions shown · non-contrast
Comparison: 06/22/2012

CLINICAL DATA: Pt reports slipping and falling at work, where her
left leg bent under her and she fell on it. Pt states she has pain
all over the knee and is unable to externally rotate.

EXAM:
LEFT KNEE - COMPLETE 4+ VIEW

[knee ap]
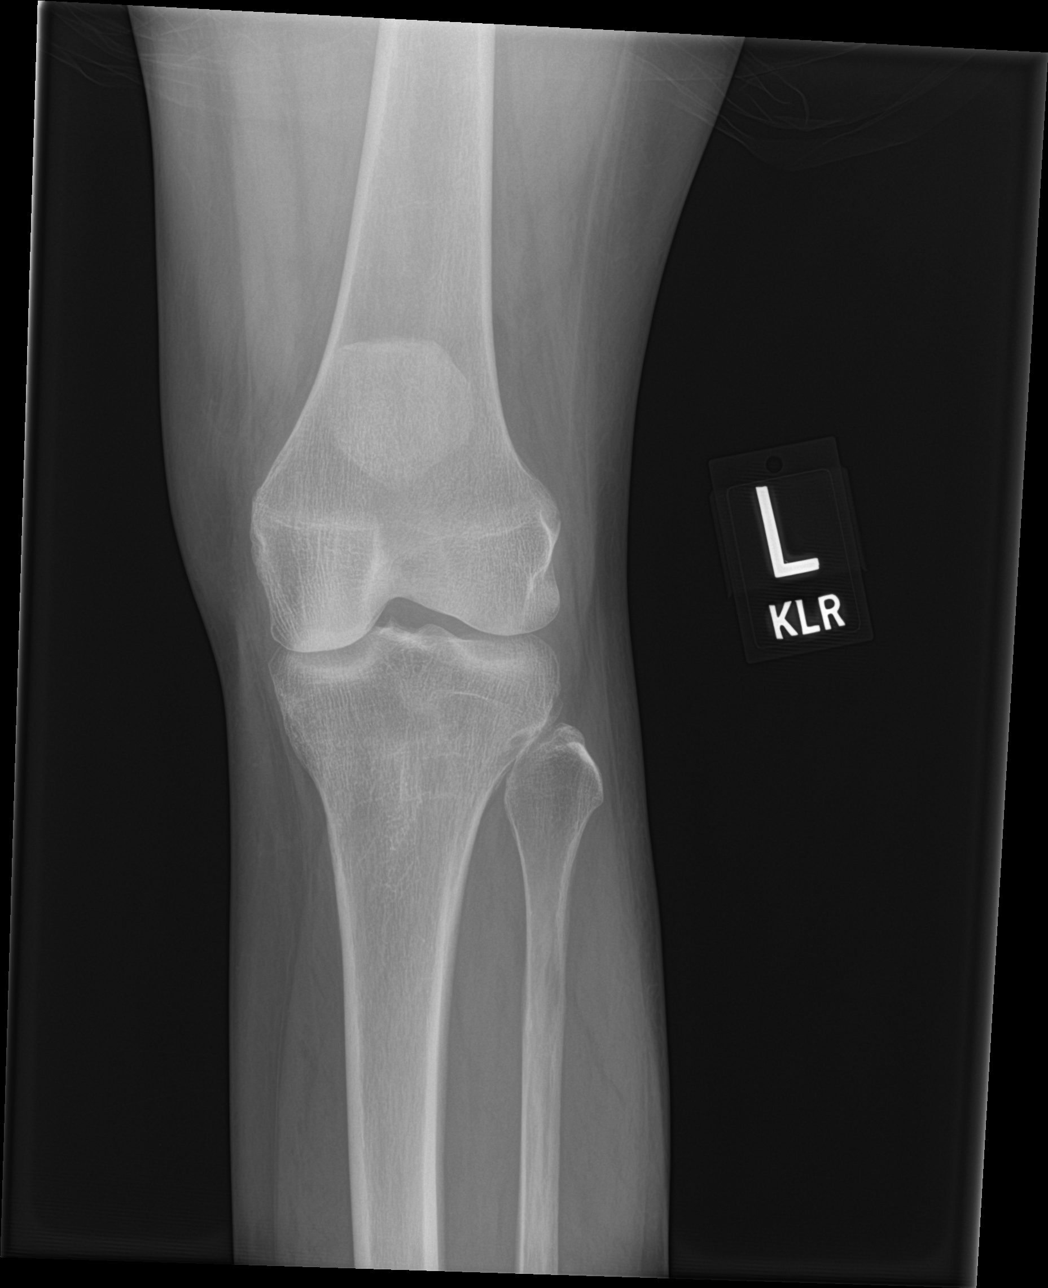

[knee lat]
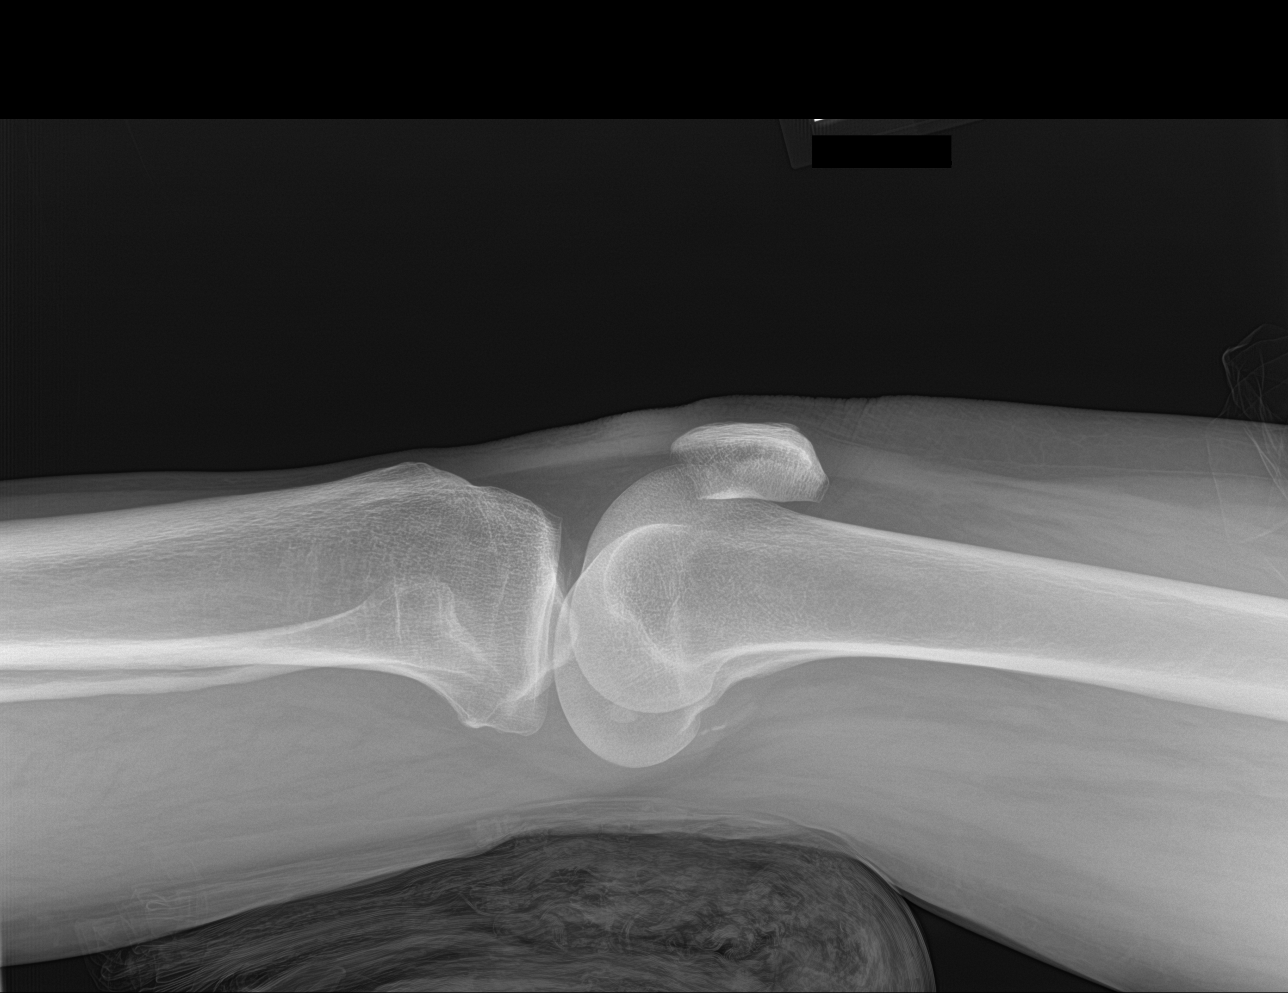

[knee obl (1 of 2)]
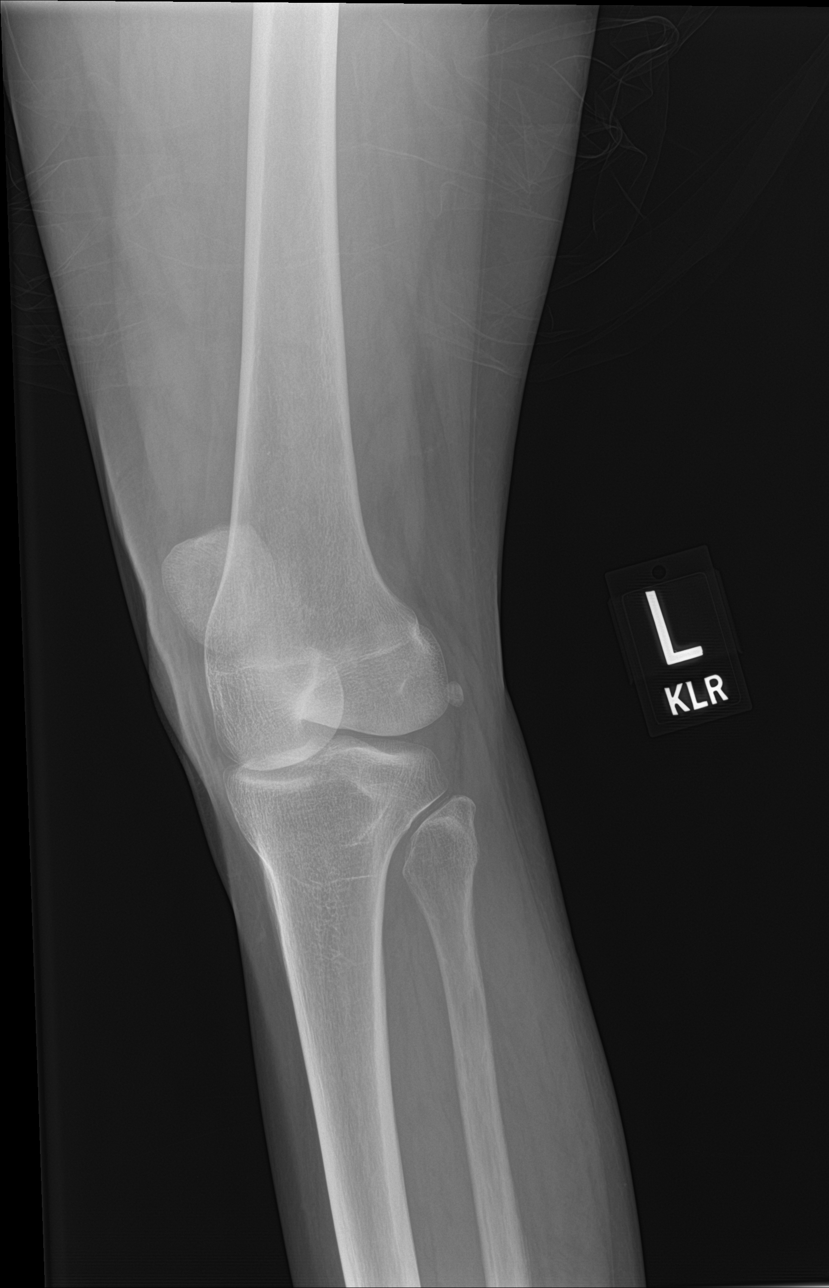

[knee obl (2 of 2)]
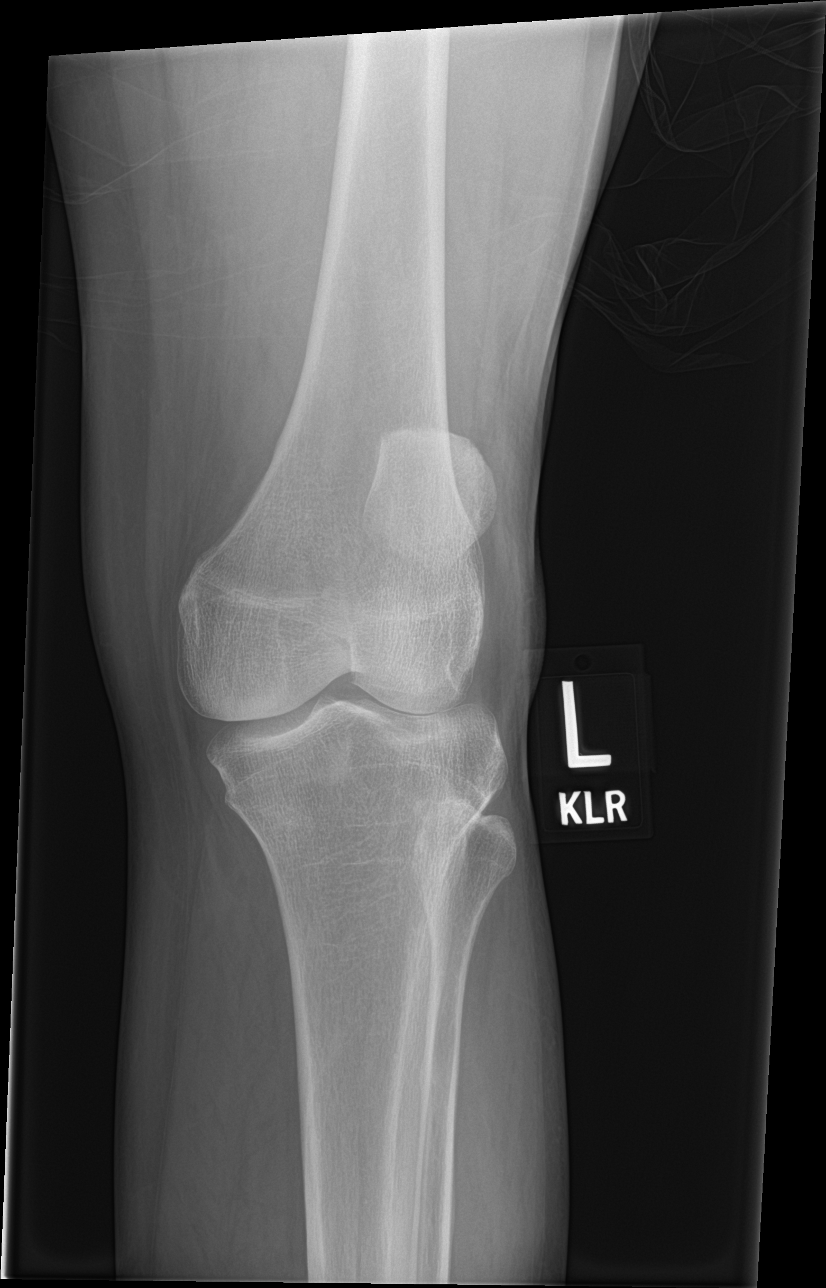

[4 of 4 positions shown; findings below may reference images not displayed]

FINDINGS: No evidence of fracture, dislocation, or joint effusion. No evidence
of arthropathy or other focal bone abnormality. Soft tissues are
unremarkable.
IMPRESSION: Negative.

## 2020-01-26 ENCOUNTER — Emergency Department
Admission: EM | Admit: 2020-01-26 | Discharge: 2020-01-26 | Disposition: A | Discharge status: Home / Self Care | Payer: Self-pay | Attending: Emergency Medicine | Admitting: Emergency Medicine

## 2020-01-26 ENCOUNTER — Encounter: Payer: Self-pay | Admitting: *Deleted

## 2020-01-26 ENCOUNTER — Other Ambulatory Visit: Payer: Self-pay

## 2020-01-26 DIAGNOSIS — B029 Zoster without complications: Secondary | ICD-10-CM | POA: Insufficient documentation

## 2020-01-26 DIAGNOSIS — Z79899 Other long term (current) drug therapy: Secondary | ICD-10-CM | POA: Insufficient documentation

## 2020-01-26 DIAGNOSIS — I10 Essential (primary) hypertension: Secondary | ICD-10-CM | POA: Insufficient documentation

## 2020-01-26 MED ORDER — LIDOCAINE 5 % EX PTCH
2.0000 | MEDICATED_PATCH | CUTANEOUS | Status: DC
  Administered 2020-01-26: 2 via TRANSDERMAL
  Filled 2020-01-26: qty 2

## 2020-01-26 MED ORDER — TRAMADOL HCL 50 MG PO TABS
50.0000 mg | ORAL_TABLET | Freq: Once | ORAL | Status: DC
  Filled 2020-01-26: qty 1

## 2020-01-26 MED ORDER — ACYCLOVIR 400 MG PO TABS: 800.0000 mg | ORAL_TABLET | Freq: Every day | ORAL | 0 refills | Status: AC

## 2020-01-26 MED ORDER — OXYCODONE-ACETAMINOPHEN 7.5-325 MG PO TABS: 1.0000 | ORAL_TABLET | Freq: Four times a day (QID) | ORAL | 0 refills | Status: AC | PRN

## 2020-01-26 MED ORDER — TRAMADOL HCL 50 MG PO TABS
50.0000 mg | ORAL_TABLET | Freq: Once | ORAL | Status: AC
  Administered 2020-01-26: 50 mg via ORAL
  Filled 2020-01-26: qty 1

## 2020-01-26 NOTE — Discharge Instructions (Signed)
Follow discharge care instruction take medication as directed.  Condition clear around follow-up with PCP to get the shingles shot.

## 2020-01-26 NOTE — ED Provider Notes (Signed)
[facility] Department Provider Note   ____________________________________________   First MD Initiated Contact with Patient 01/26/20 0710     (approximate)  I have reviewed the triage vital signs and the nursing notes.   HISTORY  Chief Complaint Rash    HPI [facility] is a 59 y.o. female patient presents with rash radiating from the right flank to right abdomen for 5 days.  Patient stated rashes very painful and unresponsive over-the-counter creams and Benadryl.  Patient rates pain as a 10/10.  Patient described the pain as "burning/stinging".         Past Medical History:  Diagnosis Date  . Cancer ([place])   . Hypertension     There are no problems to display for this patient.   Past Surgical History:  Procedure Laterality Date  . RECTAL SURGERY      Prior to Admission medications   Medication Sig Start Date End Date Taking? Authorizing Provider  acyclovir (ZOVIRAX) 400 MG tablet Take 2 tablets (800 mg total) by mouth 5 (five) times daily for 7 days. 01/26/20 02/02/20  Sable Feil, PA-C  atenolol (TENORMIN) 50 MG tablet Take 50 mg by mouth daily.    [provider]  hydrochlorothiazide (HYDRODIURIL) 25 MG tablet Take 25 mg by mouth daily.    [provider]  oxyCODONE-acetaminophen (PERCOCET) 7.5-325 MG tablet Take 1 tablet by mouth every 6 (six) hours as needed. 01/26/20   Sable Feil, PA-C  traMADol (ULTRAM) 50 MG tablet Take 1 tablet (50 mg total) by mouth every 6 (six) hours as needed for moderate pain. 05/09/19   Johnn Hai, PA-C    Allergies Sulfa antibiotics  History reviewed. No pertinent family history.  Social History Social History   Tobacco Use  . Smoking status: Current Every Day Smoker    Packs/day: 0.50    Years: 20.00    Pack years: 10.00    Types: Cigarettes  . Smokeless tobacco: Never Used  Vaping Use  . Vaping Use: Never used  Substance Use Topics  . Alcohol use:  Yes    Alcohol/week: 4.0 standard drinks    Types: 4 Cans of beer per week  . Drug use: No    Review of Systems Constitutional: No fever/chills Eyes: No visual changes. ENT: No sore throat. Cardiovascular: Denies chest pain. Respiratory: Denies shortness of breath. Gastrointestinal: No abdominal pain.  No nausea, no vomiting.  No diarrhea.  No constipation. Genitourinary: Negative for dysuria. Musculoskeletal: Negative for back pain. Skin: Negative for rash. Neurological: Negative for headaches, focal weakness or numbness. Endocrine:  Hypertension Allergic/Immunilogical: Sulfa ____________________________________________   PHYSICAL EXAM:  VITAL SIGNS: ED Triage Vitals  Enc Vitals Group     BP 01/26/20 0626 (!) 171/87     Pulse Rate 01/26/20 0626 93     Resp 01/26/20 0626 16     Temp 01/26/20 0626 98.4 F (36.9 C)     Temp Source 01/26/20 0626 Oral     SpO2 01/26/20 0626 98 %     Weight 01/26/20 0626 138 lb (62.6 kg)     Height 01/26/20 0626 5\' 8"  (1.727 m)     Head Circumference --      Peak Flow --      Pain Score 01/26/20 0627 10     Pain Loc --      Pain Edu? --      Excl. in [place]? --     Constitutional: Alert and oriented. Well  appearing and in no acute distress. Cardiovascular: Normal rate, regular rhythm. Grossly normal heart sounds.  Good peripheral circulation.  Elevated blood pressure Respiratory: Normal respiratory effort.  No retractions. Lungs CTAB. Skin:  Skin is warm, dry and intact.  Vesicular lesion radiating from the right flank to mid abdomen. Psychiatric: Mood and affect are normal. Speech and behavior are normal.  ____________________________________________   LABS (all labs ordered are listed, but only abnormal results are displayed)  Labs Reviewed - No data to display ____________________________________________  EKG   ____________________________________________  RADIOLOGY  ED MD interpretation:    Official radiology  report(s): No results found.  ____________________________________________   PROCEDURES  Procedure(s) performed (including Critical Care):  Procedures   ____________________________________________   INITIAL IMPRESSION / ASSESSMENT AND PLAN / ED COURSE  As part of my medical decision making, I reviewed the following data within the [place]     Patient presents for burning rash radiating from the right flank to mid right abdomen.  Physical exam consistent with herpes zoster.  Patient given discharge care instructions.  Patient given a prescription for acyclovir and Percocets.  Patient advised on the rash resolves to see her PCP to consider zoster vaccine.[facility] was evaluated in Emergency Department on 01/26/2020 for the symptoms described in the history of present illness. She was evaluated in the context of the global COVID-19 pandemic, which necessitated consideration that the patient might be at risk for infection with the SARS-CoV-2 virus that causes COVID-19. Institutional protocols and algorithms that pertain to the evaluation of patients at risk for COVID-19 are in a state of rapid change based on information released by regulatory bodies including the CDC and federal and state organizations. These policies and algorithms were followed during the patient's care in the ED.           ____________________________________________   FINAL CLINICAL IMPRESSION(S) / ED DIAGNOSES  Final diagnoses:  Herpes zoster without complication     ED Discharge Orders         Ordered    acyclovir (ZOVIRAX) 400 MG tablet  5 times daily     Discontinue  Reprint     01/26/20 0737    oxyCODONE-acetaminophen (PERCOCET) 7.5-325 MG tablet  Every 6 hours PRN     Discontinue  Reprint     01/26/20 0737           Note:  This document was prepared using Dragon voice recognition software and may include unintentional dictation errors.    Sable Feil,  PA-C 01/26/20 4715    Delman Kitten, MD 01/27/20 [phone]

## 2020-01-26 NOTE — ED Triage Notes (Signed)
Pt to ED with rash to the right side of abd and ribs. Pt reports the rash is very painful and she has tried multiple creams and benadryl with no relief. Pt verbalized concern for shingles.

## 2020-01-26 NOTE — ED Notes (Signed)
Dropped tramadol on the floor, had provider put order in for another one. Will waste the pill that dropped on floor

## 2020-01-31 DIAGNOSIS — B029 Zoster without complications: Secondary | ICD-10-CM | POA: Insufficient documentation

## 2020-08-31 IMAGING — CR DG LUMBAR SPINE 2-3V
1 series · 3 of 3 positions shown · non-contrast
Comparison: 03/28/2015

CLINICAL DATA: Pain for 2 weeks after lifting injury, pain down
left anterior leg to knee. No previous injury, no surgery.

EXAM:
LUMBAR SPINE - 2-3 VIEW

[Series 1: dg lumbar spine 2-3 views · 0.14mm/px · 3 of 3 slices shown]
[im 1/3]
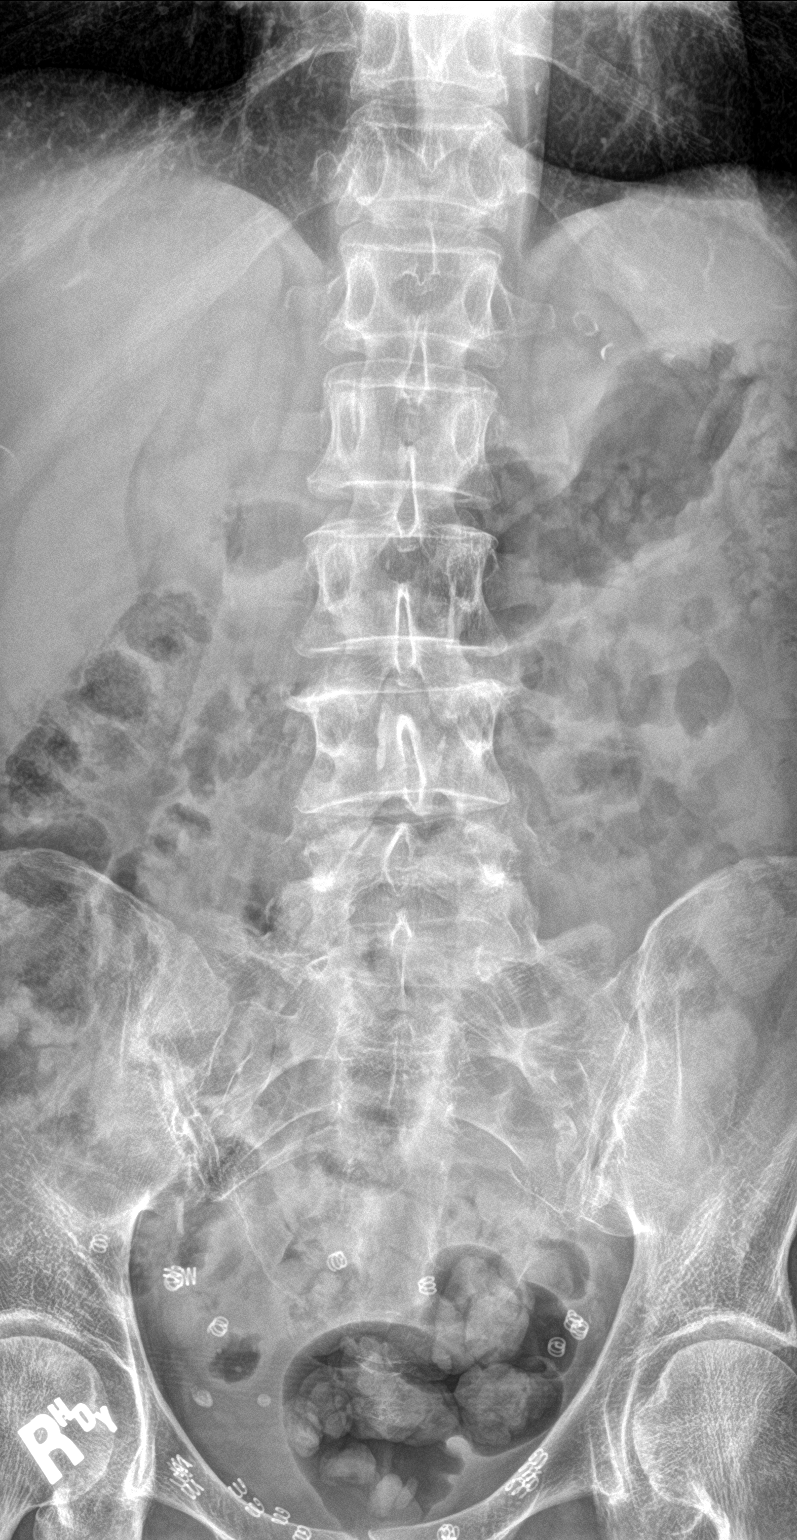
[im 2/3]
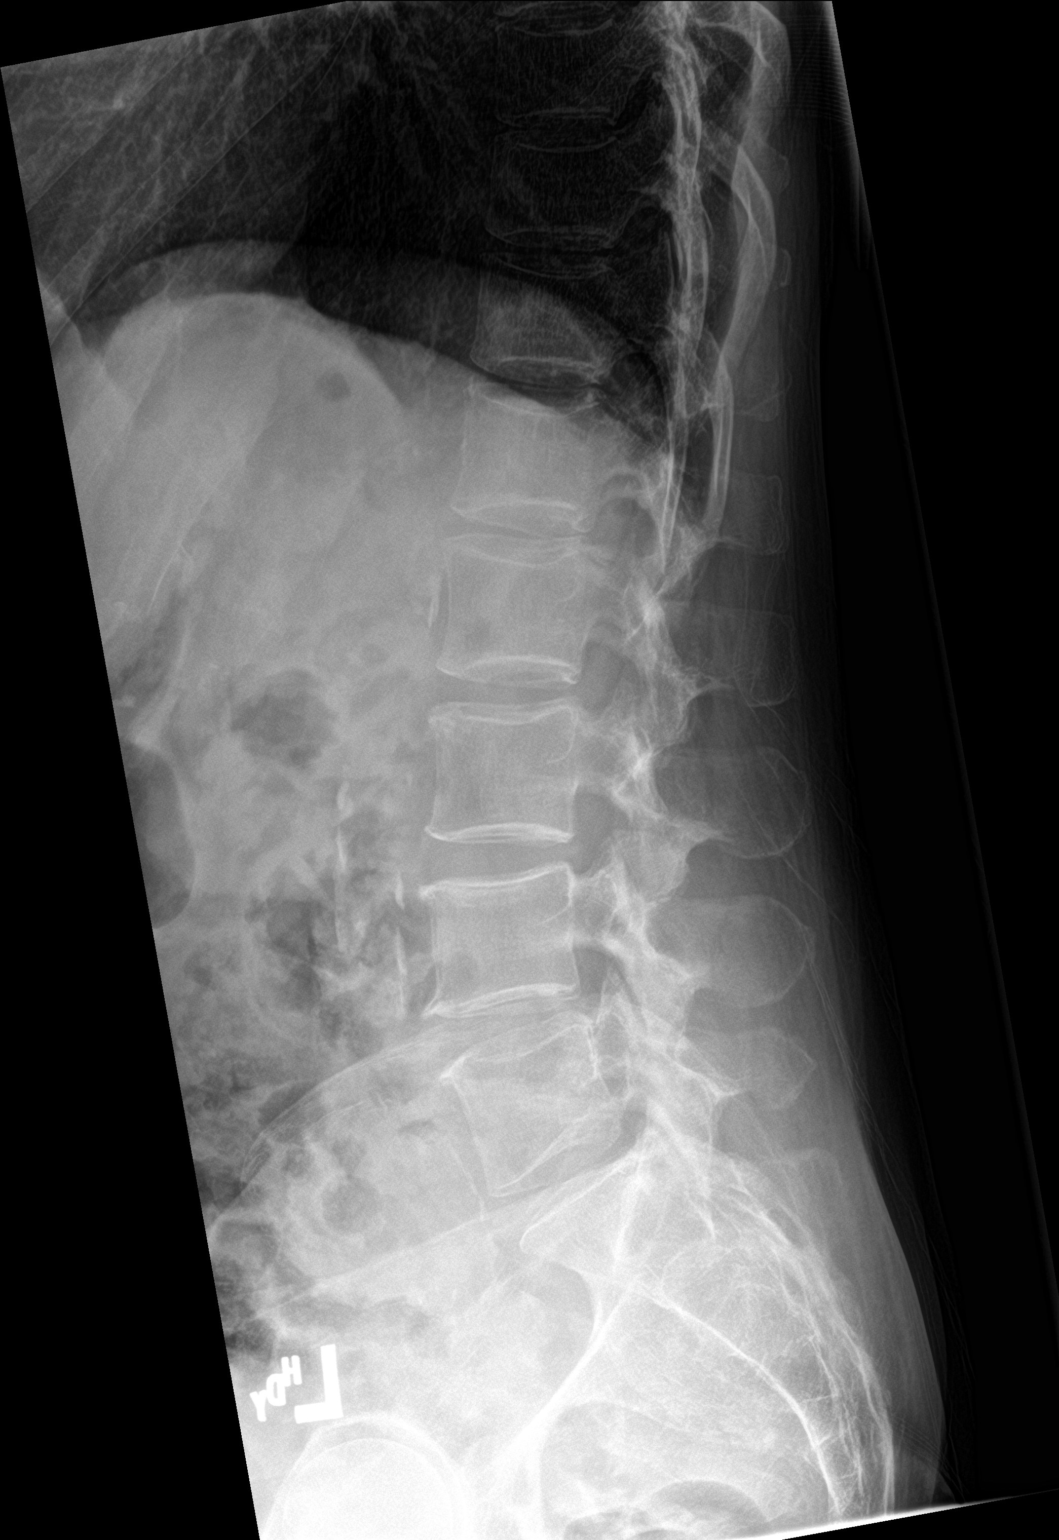
[im 3/3]
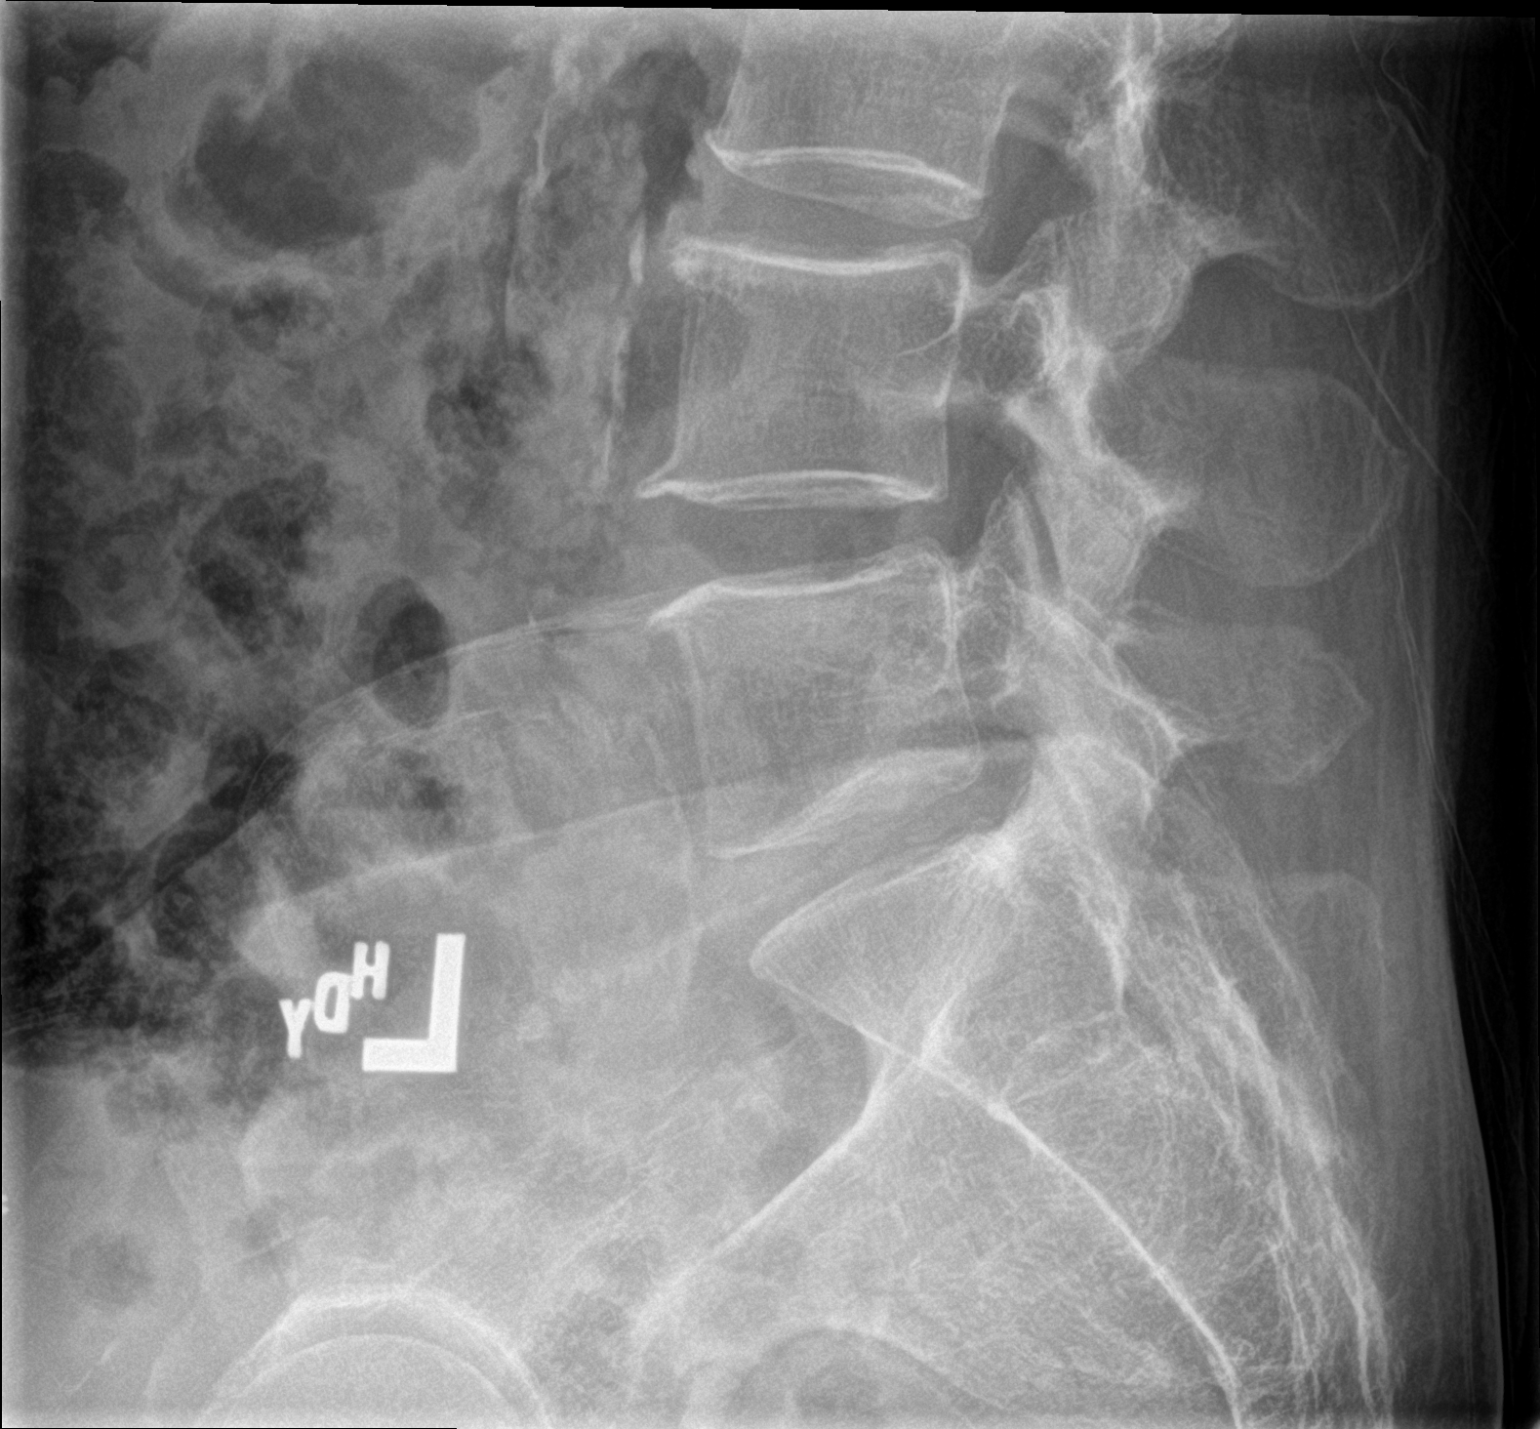

[3 of 3 positions shown; findings below may reference images not displayed]

FINDINGS: Minimal facet hypertrophy in the LOWER lumbar levels. Minimal disc
height loss at L4-5. No acute fracture or traumatic subluxation.
Insert lesions there is atherosclerotic calcification of the
abdominal aorta. Moderate stool burden. Previous hernia repair.
IMPRESSION: Mild degenerative changes. No evidence for acute abnormality.

## 2021-04-10 ENCOUNTER — Other Ambulatory Visit: Payer: Self-pay | Admitting: Family Medicine

## 2021-04-10 ENCOUNTER — Other Ambulatory Visit: Payer: Self-pay

## 2021-04-10 ENCOUNTER — Ambulatory Visit
Admission: RE | Admit: 2021-04-10 | Discharge: 2021-04-10 | Disposition: A | Discharge status: Home / Self Care | Payer: Self-pay | Source: Ambulatory Visit | Attending: Family Medicine | Admitting: Family Medicine

## 2021-04-10 ENCOUNTER — Ambulatory Visit
Admission: RE | Admit: 2021-04-10 | Discharge: 2021-04-10 | Disposition: A | Discharge status: Home / Self Care | Payer: Self-pay | Attending: Family Medicine | Admitting: Family Medicine

## 2021-04-10 DIAGNOSIS — M79672 Pain in left foot: Secondary | ICD-10-CM

## 2021-04-10 DIAGNOSIS — M79673 Pain in unspecified foot: Secondary | ICD-10-CM

## 2021-10-13 ENCOUNTER — Other Ambulatory Visit: Payer: Self-pay | Admitting: Family Medicine

## 2021-10-13 DIAGNOSIS — J40 Bronchitis, not specified as acute or chronic: Secondary | ICD-10-CM

## 2022-04-23 DIAGNOSIS — K6289 Other specified diseases of anus and rectum: Secondary | ICD-10-CM | POA: Insufficient documentation

## 2022-08-12 ENCOUNTER — Telehealth: Payer: Self-pay

## 2022-08-12 NOTE — Telephone Encounter (Signed)
Per Dr.Piscoya-patient needs to be referred to Colorectal surgeon . Spoke with patient and she was provided [facility] contact information- and was instructed to call PCP to redirect the referral .

## 2022-08-12 NOTE — Telephone Encounter (Signed)
-----   Message from Olean Ree, MD sent at 08/12/2022  2:44 PM EST ----- Regarding: office appointment 08/16/22 Hi,  This patient has a new patient appointment with me on 08/16/22.  Can y'all check what it's for?  She has a history of squamous cell cancer of perianal skin and has had prior surgery at [facility].  She's also had perianal pain and they're following her for that.  If this is for rectal/perianal pain, she needs to go to them instead of [place].  If  she does not want to go back to them, then her PCP needs to send a referral to a colorectal surgeon.  Thanks,  [employer]

## 2022-08-16 ENCOUNTER — Ambulatory Visit: Payer: Medicaid Other | Admitting: Surgery

## 2023-01-04 ENCOUNTER — Emergency Department: Payer: Medicaid Other

## 2023-01-04 ENCOUNTER — Emergency Department
Admission: EM | Admit: 2023-01-04 | Discharge: 2023-01-04 | Disposition: A | Discharge status: Home / Self Care | Payer: Medicaid Other | Attending: Emergency Medicine | Admitting: Emergency Medicine

## 2023-01-04 ENCOUNTER — Encounter: Payer: Self-pay | Admitting: Emergency Medicine

## 2023-01-04 ENCOUNTER — Other Ambulatory Visit: Payer: Self-pay

## 2023-01-04 DIAGNOSIS — E876 Hypokalemia: Secondary | ICD-10-CM | POA: Insufficient documentation

## 2023-01-04 DIAGNOSIS — Z85048 Personal history of other malignant neoplasm of rectum, rectosigmoid junction, and anus: Secondary | ICD-10-CM | POA: Insufficient documentation

## 2023-01-04 DIAGNOSIS — K2900 Acute gastritis without bleeding: Secondary | ICD-10-CM | POA: Diagnosis not present

## 2023-01-04 DIAGNOSIS — R1010 Upper abdominal pain, unspecified: Secondary | ICD-10-CM | POA: Diagnosis present

## 2023-01-04 DIAGNOSIS — R1011 Right upper quadrant pain: Secondary | ICD-10-CM

## 2023-01-04 LAB — URINALYSIS, ROUTINE W REFLEX MICROSCOPIC
Bilirubin Urine: NEGATIVE
Glucose, UA: NEGATIVE mg/dL
Hgb urine dipstick: NEGATIVE
Ketones, ur: NEGATIVE mg/dL
Nitrite: NEGATIVE
Protein, ur: NEGATIVE mg/dL
Specific Gravity, Urine: 1.008 (ref 1.005–1.030)
pH: 7 (ref 5.0–8.0)

## 2023-01-04 LAB — CBC
HCT: 40.1 % (ref 36.0–46.0)
Hemoglobin: 14.2 g/dL (ref 12.0–15.0)
MCH: 32.2 pg (ref 26.0–34.0)
MCHC: 35.4 g/dL (ref 30.0–36.0)
MCV: 90.9 fL (ref 80.0–100.0)
Platelets: 162 10*3/uL (ref 150–400)
RBC: 4.41 MIL/uL (ref 3.87–5.11)
RDW: 12 % (ref 11.5–15.5)
WBC: 6.3 10*3/uL (ref 4.0–10.5)
nRBC: 0 % (ref 0.0–0.2)

## 2023-01-04 LAB — COMPREHENSIVE METABOLIC PANEL
ALT: 17 U/L (ref 0–44)
AST: 19 U/L (ref 15–41)
Albumin: 4.7 g/dL (ref 3.5–5.0)
Alkaline Phosphatase: 55 U/L (ref 38–126)
Anion gap: 11 (ref 5–15)
BUN: 15 mg/dL (ref 6–20)
CO2: 26 mmol/L (ref 22–32)
Calcium: 9.3 mg/dL (ref 8.9–10.3)
Chloride: 98 mmol/L (ref 98–111)
Creatinine, Ser: 0.79 mg/dL (ref 0.44–1.00)
GFR, Estimated: >60 mL/min (ref >60)
Glucose, Bld: 112 mg/dL — ABNORMAL HIGH (ref 70–99)
Potassium: 2.6 mmol/L — CL (ref 3.5–5.1)
Sodium: 135 mmol/L (ref 135–145)
Total Bilirubin: 0.6 mg/dL (ref 0.3–1.2)
Total Protein: 7.3 g/dL (ref 6.5–8.1)

## 2023-01-04 LAB — TROPONIN I (HIGH SENSITIVITY): Troponin I (High Sensitivity): 3 ng/L (ref <18)

## 2023-01-04 LAB — MAGNESIUM: Magnesium: 1.8 mg/dL (ref 1.7–2.4)

## 2023-01-04 LAB — LIPASE, BLOOD: Lipase: 31 U/L (ref 11–51)

## 2023-01-04 MED ORDER — POTASSIUM CHLORIDE CRYS ER 20 MEQ PO TBCR: 20.0000 meq | EXTENDED_RELEASE_TABLET | Freq: Two times a day (BID) | ORAL | 0 refills | Status: AC

## 2023-01-04 MED ORDER — ONDANSETRON HCL 4 MG/2ML IJ SOLN
4.0000 mg | Freq: Once | INTRAMUSCULAR | Status: AC
  Administered 2023-01-04: 4 mg via INTRAVENOUS
  Filled 2023-01-04: qty 2

## 2023-01-04 MED ORDER — HYDROMORPHONE HCL 1 MG/ML IJ SOLN
0.5000 mg | Freq: Once | INTRAMUSCULAR | Status: AC
  Administered 2023-01-04: 0.5 mg via INTRAVENOUS
  Filled 2023-01-04: qty 0.5

## 2023-01-04 MED ORDER — POTASSIUM CHLORIDE CRYS ER 20 MEQ PO TBCR
40.0000 meq | EXTENDED_RELEASE_TABLET | Freq: Once | ORAL | Status: AC
  Administered 2023-01-04: 40 meq via ORAL
  Filled 2023-01-04: qty 2

## 2023-01-04 MED ORDER — IOHEXOL 300 MG/ML  SOLN
100.0000 mL | Freq: Once | INTRAMUSCULAR | Status: AC | PRN
  Administered 2023-01-04: 100 mL via INTRAVENOUS

## 2023-01-04 MED ORDER — SUCRALFATE 1 G PO TABS: 1.0000 g | ORAL_TABLET | Freq: Three times a day (TID) | ORAL | 0 refills | Status: AC

## 2023-01-04 MED ORDER — PANTOPRAZOLE SODIUM 40 MG PO TBEC: 40.0000 mg | DELAYED_RELEASE_TABLET | Freq: Every day | ORAL | 0 refills | Status: AC

## 2023-01-04 MED ORDER — POTASSIUM CHLORIDE 10 MEQ/100ML IV SOLN
10.0000 meq | INTRAVENOUS | Status: AC
  Administered 2023-01-04 (×2): 10 meq via INTRAVENOUS
  Filled 2023-01-04 (×3): qty 100

## 2023-01-04 MED ORDER — LIDOCAINE 5 % EX PTCH: 1.0000 | MEDICATED_PATCH | Freq: Two times a day (BID) | CUTANEOUS | 0 refills | Status: AC

## 2023-01-04 NOTE — ED Provider Notes (Signed)
[facility] Provider Note    Event Date/Time   First MD Initiated Contact with Patient 01/04/23 1843     (approximate)   History   Abdominal Pain   HPI  [facility] is a 62 y.o. female  with chronic hep C who was previously treated and reportedly not had issues with the hepatitis since then, prior rectal cancer status post resection who comes in with concerns for right-sided abdominal pain.  Patient reports about a week of severe pain on her right upper abdomen and having intermittent episodes of bloating, nausea.  She states that she has multiple episodes of diarrhea daily but it is more likely related to her prior rectal cancer.  She reports a lot of rectal pressure but is had recent reassuring colonoscopy and MRI that have rule out any type of abscess.  They stated they are not sure why she has all this rectal pressure but her new symptoms today are more of the upper abdominal discomfort.  She denies any chest pain, shortness of breath or other symptoms.   Physical Exam   Triage Vital Signs: ED Triage Vitals  Enc Vitals Group     BP 01/04/23 1732 (!) 172/89     Pulse Rate 01/04/23 1732 80     Resp 01/04/23 1732 18     Temp 01/04/23 1732 98.3 F (36.8 C)     Temp Source 01/04/23 1732 Oral     SpO2 01/04/23 1732 98 %     Weight --      Height --      Head Circumference --      Peak Flow --      Pain Score 01/04/23 1731 10     Pain Loc --      Pain Edu? --      Excl. in [place]? --     Most recent vital signs: Vitals:   01/04/23 1732  BP: (!) 172/89  Pulse: 80  Resp: 18  Temp: 98.3 F (36.8 C)  SpO2: 98%     General: Awake, no distress.  CV:  Good peripheral perfusion.  Resp:  Normal effort.  Abd:  Tender in the upper abdomen.  No rebound, no guarding. Other:     ED Results / Procedures / Treatments   Labs (all labs ordered are listed, but only abnormal results are displayed) Labs Reviewed  COMPREHENSIVE METABOLIC PANEL -  Abnormal; Notable for the following components:      Result Value   Potassium 2.6 (*)    Glucose, Bld 112 (*)    All other components within normal limits  URINALYSIS, ROUTINE W REFLEX MICROSCOPIC - Abnormal; Notable for the following components:   Color, Urine YELLOW (*)    APPearance CLEAR (*)    Leukocytes,Ua SMALL (*)    Bacteria, UA RARE (*)    All other components within normal limits  LIPASE, BLOOD  CBC  MAGNESIUM  TROPONIN I (HIGH SENSITIVITY)     EKG  My interpretation of EKG:  Normal sinus rate of 64 without any ST elevation or T wave inversions, type I AV block  RADIOLOGY I have reviewed the ultrasound personally interpreted no evidence of any gallstones  PROCEDURES:  Critical Care performed: No  Procedures   MEDICATIONS ORDERED IN ED: Medications  potassium chloride SA (KLOR-CON M) CR tablet 40 mEq (has no administration in time range)  potassium chloride 10 mEq in 100 mL IVPB (has no administration in time range)  HYDROmorphone (  DILAUDID) injection 0.5 mg (0.5 mg Intravenous Given 01/04/23 1913)  ondansetron (ZOFRAN) injection 4 mg (4 mg Intravenous Given 01/04/23 1914)  iohexol (OMNIPAQUE) 300 MG/ML solution 100 mL (100 mLs Intravenous Contrast Given 01/04/23 1919)     IMPRESSION / MDM / ASSESSMENT AND PLAN / ED COURSE  I reviewed the triage vital signs and the nursing notes.   Patient's presentation is most consistent with acute presentation with potential threat to life or bodily function.   Patient comes in with concerns for upper abdominal discomfort.  Will get ultrasound to rule out gallstones but given her history of cancer we will also get CT scan to make sure no evidence of any metastatic disease, liver cancer etc.  She has history of hepatitis but her LFTs are reassuring.  No rash noted.  Will get EKG given her hypokalemia but patient does report chronic issues with diarrhea which I suspect is the cause of this.  Will add on magnesium.  CBC  reassuring CMP reassuring except for low potassium.  Lipase normal.  Troponin negative  CT imaging was reassuring.  Her ultrasound was also negative for gallstones.  Reevaluated patient updated on results.  Patient confused without having any answers but she does report sometimes the pain is worse after eating with a little bit of epigastric discomfort.  Will treat her for possible gastritis, ulcers and she can follow-up with her GI doctor.  We discussed PPI, Carafate, lidocaine patches.  She expressed understanding and felt comfortable with discharge home.  She continues deny any shortness of breath, coughing is no rash noted.  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  Her EKG is without any changes and she is asymptomatic from it.    FINAL CLINICAL IMPRESSION(S) / ED DIAGNOSES   Final diagnoses:  Right upper quadrant abdominal pain  Hypokalemia  Acute gastritis without hemorrhage, unspecified gastritis type     Rx / DC Orders   ED Discharge Orders          Ordered    pantoprazole (PROTONIX) 40 MG tablet  Daily        01/04/23 2109    sucralfate (CARAFATE) 1 g tablet  3 times daily with meals & bedtime        01/04/23 2109    lidocaine (LIDODERM) 5 %  Every 12 hours        01/04/23 2109    potassium chloride SA (KLOR-CON M) 20 MEQ tablet  2 times daily        01/04/23 2109             Note:  This document was prepared using Dragon voice recognition software and may include unintentional dictation errors.   Concha Se, MD 01/04/23 2111

## 2023-01-04 NOTE — Discharge Instructions (Addendum)
Your potassium level was very low but sounds like you have got chronic issues with this.  You should take the potassium supplements and have this rechecked by Friday or Monday.  Return to the ER if you develop dizziness, syncope or any other concerns.  We are starting you on some medications in case this could be related to some inflammation of your stomach with some acid reducers.  Please call your GI doctor to make a follow-up appointment and return to the ER if you develop worsening symptoms or any other concerns

## 2023-01-04 NOTE — ED Notes (Signed)
Pt refused EKG and stated she didn't think she needed it.

## 2023-01-04 NOTE — ED Triage Notes (Signed)
Patient to ED via [facility] from [facility] for right sided abd pain. Concerned for gallbladder. Also having nausea. Ongoing for 1 week.

## 2023-03-26 IMAGING — CR DG OS CALCIS 2+V*L*
1 series · 2 of 2 positions shown · non-contrast
Comparison: None.

CLINICAL DATA: Left heel pain

EXAM:
LEFT OS CALCIS - 2+ VIEW

[Series 1: dg os calcis left · 0.14mm/px · 2 of 2 slices shown]
[im 1/2]
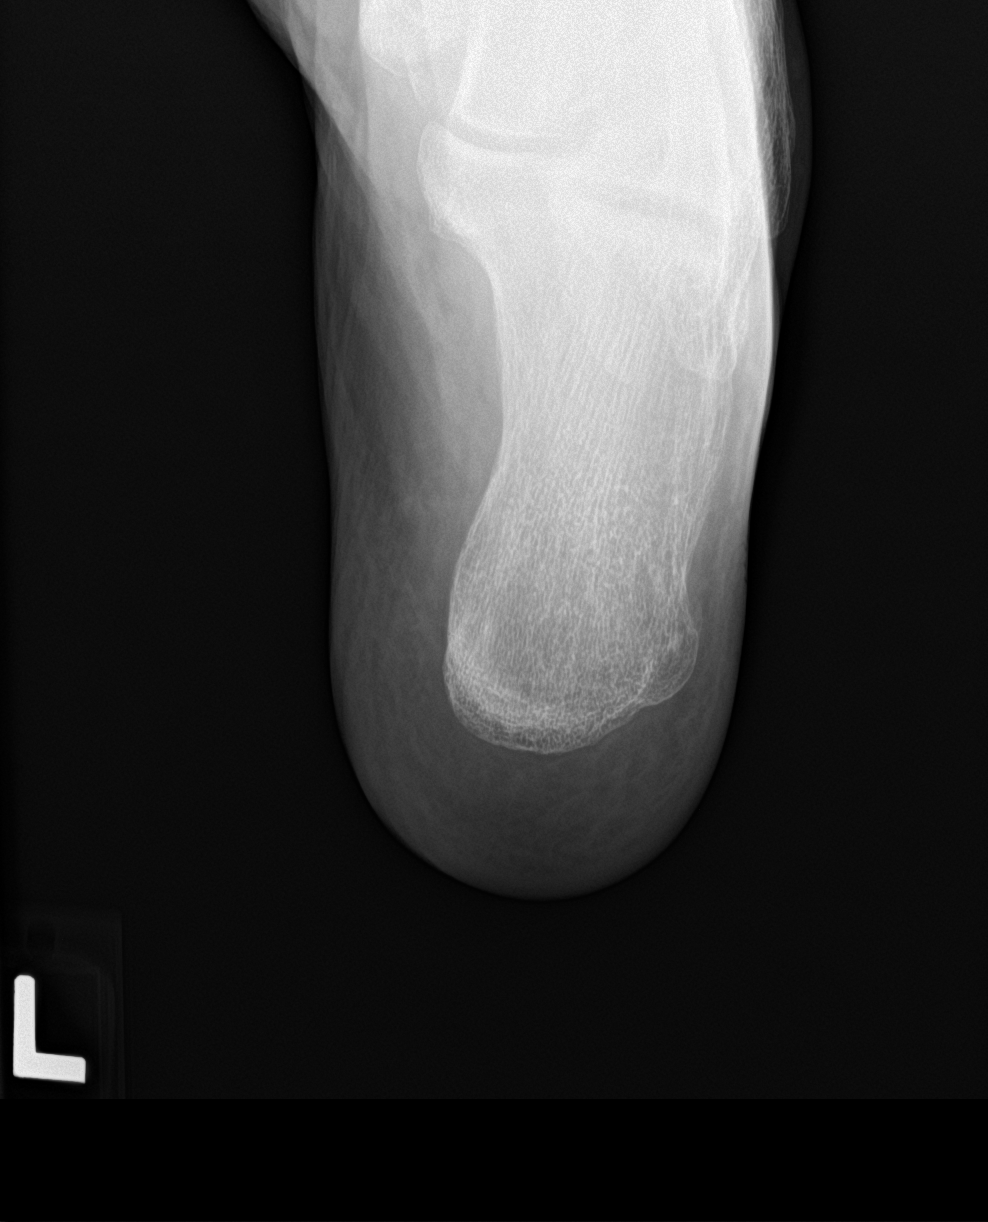
[im 2/2]
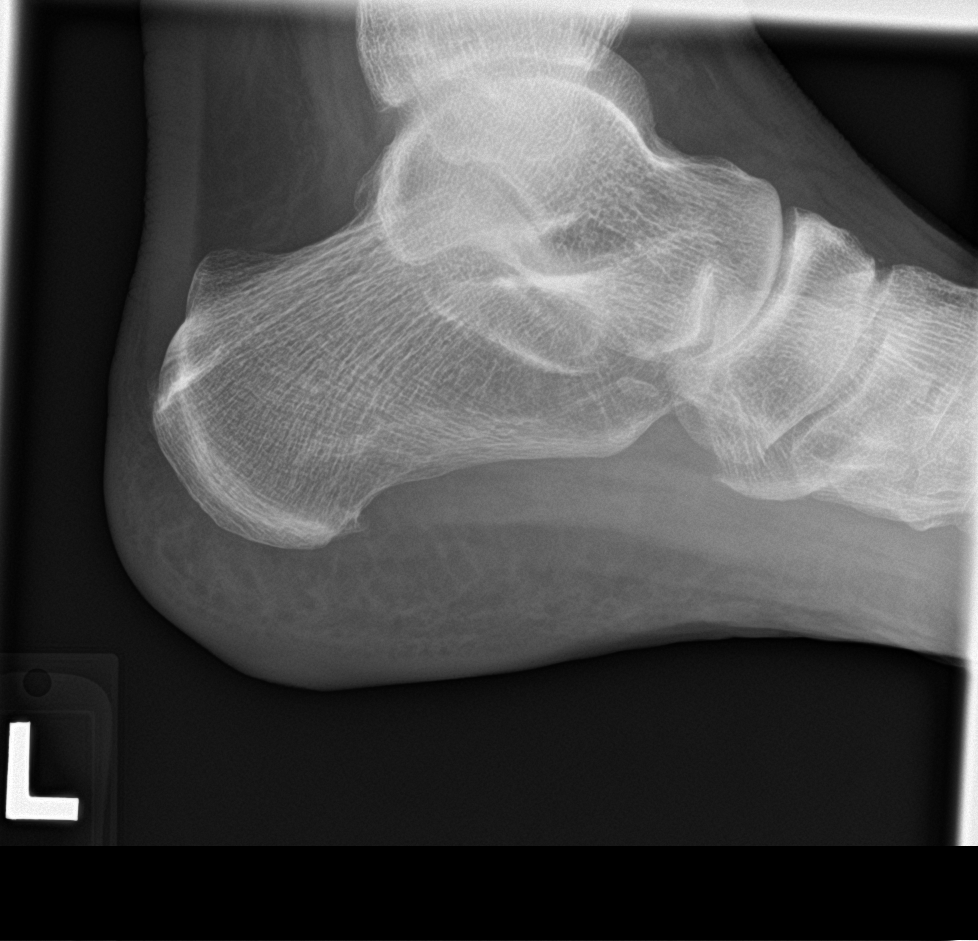

[2 of 2 positions shown; findings below may reference images not displayed]

FINDINGS: There is no evidence of acute displaced fracture. There is no
significant degenerative change. Tiny plantar calcaneal spur and
Achilles enthesophyte. Small Haglund deformity. Small,
longitudinally oriented linear sclerosis along the posterosuperior
aspect of the calcaneus near the Achilles insertion.
IMPRESSION: Small, longitudinally oriented linear sclerosis along the
posterosuperior aspect of the calcaneus near the Achilles insertion,
could represent a chronic stress injury, though this orientation
would be atypical.

Tiny plantar calcaneal spur and Achilles enthesophytes.

Small Haglund deformity.

## 2023-04-14 DIAGNOSIS — K5902 Outlet dysfunction constipation: Secondary | ICD-10-CM | POA: Insufficient documentation

## 2023-09-14 DIAGNOSIS — R109 Unspecified abdominal pain: Secondary | ICD-10-CM | POA: Insufficient documentation

## 2023-09-14 DIAGNOSIS — K638219 Small intestinal bacterial overgrowth, unspecified: Secondary | ICD-10-CM | POA: Insufficient documentation

## 2023-12-05 ENCOUNTER — Encounter (INDEPENDENT_AMBULATORY_CARE_PROVIDER_SITE_OTHER): Admitting: Nurse Practitioner

## 2023-12-13 DIAGNOSIS — R140 Abdominal distension (gaseous): Secondary | ICD-10-CM | POA: Insufficient documentation

## 2023-12-13 DIAGNOSIS — R1011 Right upper quadrant pain: Secondary | ICD-10-CM | POA: Insufficient documentation

## 2023-12-21 DIAGNOSIS — Z79899 Other long term (current) drug therapy: Secondary | ICD-10-CM | POA: Insufficient documentation

## 2023-12-21 DIAGNOSIS — Z789 Other specified health status: Secondary | ICD-10-CM | POA: Insufficient documentation

## 2023-12-21 DIAGNOSIS — G894 Chronic pain syndrome: Secondary | ICD-10-CM | POA: Insufficient documentation

## 2023-12-21 DIAGNOSIS — M899 Disorder of bone, unspecified: Secondary | ICD-10-CM | POA: Insufficient documentation

## 2023-12-21 NOTE — Patient Instructions (Signed)

## 2023-12-21 NOTE — Progress Notes (Unsigned)
 PROVIDER NOTE: Interpretation of information contained herein should be left to medically-trained personnel. Specific patient instructions are provided elsewhere under "Patient Instructions" section of medical record. This document was created in part using AI and STT-dictation technology, any transcriptional errors that may result from this process are unintentional.  Patient: Lindsey Park  Service: E/M Encounter  Provider: Candi Chafe, MD  DOB: [birth year]  Delivery: Face-to-face  Specialty: Interventional Pain Management  MRN: [id number]  Setting: Ambulatory outpatient facility  Specialty designation: 09  Type: New Patient  Location: Outpatient office facility  PCP: Macie Saxon, MD  DOS: 12/22/2023    Referring Prov.: Dewitt Forehand, MD   Primary Reason(s) for Visit: Encounter for initial evaluation of one or more chronic problems (new to examiner) potentially causing chronic pain, and posing a threat to normal musculoskeletal function. (Level of risk: High) CC: No chief complaint on file.  HPI  Ms. Delcourt is a 63 y.o. year old, female patient, who comes for the first time to our practice referred by Dewitt Forehand, MD for our initial evaluation of her chronic pain. She has Abdominal bloating; Abdominal pain; Anorectal pain; Caregiver stress; Carpal tunnel syndrome; Chronic hepatitis C without hepatic coma (HCC); Cirrhosis of liver without ascites (HCC); Constipation due to outlet dysfunction; Dermatitis; Elevation of level of transaminase and lactic acid dehydrogenase (LDH); Hypertension; Numbness; Other specified anxiety disorders; Primary squamous cell carcinoma of anus (HCC); Right upper quadrant abdominal pain; Sciatica; Shingles; Small intestinal bacterial overgrowth, unspecified; Chronic pain syndrome; Pharmacologic therapy; Disorder of skeletal system; and Problems influencing health status on their problem list. Today she comes in for evaluation of her No chief complaint on file.  Pain  Assessment: Location:     Radiating:   Onset:   Duration:   Quality:   Severity:  /10 (subjective, self-reported pain score)  Effect on ADL:   Timing:   Modifying factors:   BP:    HR:    Onset and Duration: {Hx; Onset and Duration:210120511} Cause of pain: {Hx; Cause:210120521} Severity: {Pain Severity:210120502} Timing: {Symptoms; Timing:210120501} Aggravating Factors: {Causes; Aggravating pain factors:210120507} Alleviating Factors: {Causes; Alleviating Factors:210120500} Associated Problems: {Hx; Associated problems:210120515} Quality of Pain: {Hx; Symptom quality or Descriptor:210120531} Previous Examinations or Tests: {Hx; Previous examinations or test:210120529} Previous Treatments: {Hx; Previous Treatment:210120503}  Ms. Swilling is being evaluated for possible interventional pain management therapies for the treatment of her chronic pain.  Discussed the use of AI scribe software for clinical note transcription with the patient, who gave verbal consent to proceed.  History of Present Illness           ***  Ms. Noguez has been informed that this initial visit was an evaluation only.  On the follow up appointment I will go over the results, including ordered tests and available interventional therapies. At that time she will have the opportunity to decide whether to proceed with offered therapies or not. In the event that Ms. Soldo prefers avoiding interventional options, this will conclude our involvement in the case.  Medication management recommendations may be provided upon request.  Patient informed that diagnostic tests may be ordered to assist in identifying underlying causes, narrow the list of differential diagnoses and aid in determining candidacy for (or contraindications to) planned therapeutic interventions.  Historic Controlled Substance Pharmacotherapy Review  PMP and historical list of controlled substances: Hydrocodone /APAP 5/325 (60/month), 1 tab p.o. twice  daily (# 10) (last filled on 10/06/2023); gabapentin 100 mg capsule (90/month), 1 cap p.o. 3 times daily (last filled  on 10/25/2022) Most recently prescribed opioid analgesics: None MME/day: 0 mg/day  Historical Monitoring: The patient  reports no history of drug use. List of prior UDS Testing: No results found for: "MDMA", "COCAINSCRNUR", "PCPSCRNUR", "PCPQUANT", "CANNABQUANT", "THCU", "ETH", "CBDTHCR", "D8THCCBX", "D9THCCBX" Historical Background Evaluation: [place] PMP: PDMP reviewed during this encounter. Review of the past 34-months conducted.             PMP NARX Score Report:  Narcotic: 190 Sedative: 090 Stimulant: 000 [place] Department of public safety, offender search: Engineer, mining Information) Non-contributory Risk Assessment Profile: Aberrant behavior: None observed or detected today Risk factors for fatal opioid overdose: None identified today PMP NARX Overdose Risk Score: 300 Fatal overdose hazard ratio (HR): Calculation deferred Non-fatal overdose hazard ratio (HR): Calculation deferred Risk of opioid abuse or dependence: 0.7-3.0% with doses <= 36 MME/day and 6.1-26% with doses >= 120 MME/day. Substance use disorder (SUD) risk level: See below Personal History of Substance Abuse (SUD-Substance use disorder):  Alcohol:    Illegal Drugs:    Rx Drugs:    ORT Risk Level calculation:    ORT Scoring interpretation table:  Score <3 = Low Risk for SUD  Score between 4-7 = Moderate Risk for SUD  Score >8 = High Risk for Opioid Abuse   PHQ-2 Depression Scale:  Total score:    PHQ-2 Scoring interpretation table: (Score and probability of major depressive disorder)  Score 0 = No depression  Score 1 = 15.4% Probability  Score 2 = 21.1% Probability  Score 3 = 38.4% Probability  Score 4 = 45.5% Probability  Score 5 = 56.4% Probability  Score 6 = 78.6% Probability   PHQ-9 Depression Scale:  Total score:    PHQ-9 Scoring interpretation table:  Score 0-4 = No depression  Score 5-9 = Mild  depression  Score 10-14 = Moderate depression  Score 15-19 = Moderately severe depression  Score 20-27 = Severe depression (2.4 times higher risk of SUD and 2.89 times higher risk of overuse)   Pharmacologic Plan: As per protocol, I have not taken over any controlled substance management, pending the results of ordered tests and/or consults.            Initial impression: Pending review of available data and ordered tests.  Meds   Current Outpatient Medications:    atenolol-chlorthalidone (TENORETIC) 50-25 MG tablet, Take 1 tablet by mouth daily., Disp: , Rfl:    dicyclomine (BENTYL) 10 MG capsule, Take 10 mg by mouth 3 (three) times daily., Disp: , Rfl:    dicyclomine (BENTYL) 20 MG tablet, Take 20 mg by mouth., Disp: , Rfl:    doxycycline (VIBRA-TABS) 100 MG tablet, Take 100 mg by mouth 2 (two) times daily., Disp: , Rfl:    gabapentin (NEURONTIN) 100 MG capsule, Take 1 capsule by mouth 3 (three) times daily., Disp: , Rfl:    hyoscyamine (ANASPAZ) 0.125 MG TBDP disintergrating tablet, Place 0.125 mg under the tongue every 4 (four) hours as needed., Disp: , Rfl:    potassium chloride  (KLOR-CON  M) 10 MEQ tablet, Take 10 mEq by mouth., Disp: , Rfl:    valACYclovir (VALTREX) 1000 MG tablet, Take 1,000 mg by mouth 3 (three) times daily., Disp: , Rfl:    atenolol (TENORMIN) 50 MG tablet, Take 50 mg by mouth daily., Disp: , Rfl:    hydrochlorothiazide (HYDRODIURIL) 25 MG tablet, Take 25 mg by mouth daily., Disp: , Rfl:    HYDROcodone -acetaminophen  (NORCO/VICODIN) 5-325 MG tablet, SMARTSIG:1 Tablet(s) By Mouth Every 12 Hours PRN, Disp: ,  Rfl:    oxyCODONE -acetaminophen  (PERCOCET) 7.5-325 MG tablet, Take 1 tablet by mouth every 6 (six) hours as needed., Disp: 20 tablet, Rfl: 0   pantoprazole  (PROTONIX ) 40 MG tablet, Take 1 tablet (40 mg total) by mouth daily., Disp: 30 tablet, Rfl: 0   potassium chloride  SA (KLOR-CON  M) 20 MEQ tablet, Take 1 tablet (20 mEq total) by mouth 2 (two) times daily for 3  days., Disp: 6 tablet, Rfl: 0   simethicone (MYLICON) 80 MG chewable tablet, Chew 80 mg by mouth., Disp: , Rfl:    sucralfate  (CARAFATE ) 1 g tablet, Take 1 tablet (1 g total) by mouth 4 (four) times daily -  with meals and at bedtime for 14 days., Disp: 56 tablet, Rfl: 0   traMADol  (ULTRAM ) 50 MG tablet, Take 1 tablet (50 mg total) by mouth every 6 (six) hours as needed for moderate pain., Disp: 10 tablet, Rfl: 0  Imaging Review  Lumbosacral Imaging: Lumbar DG 2-3 views: Results for orders placed during the hospital encounter of 09/16/18 DG Lumbar Spine 2-3 Views  Narrative CLINICAL DATA:  Pain for 2 weeks after lifting injury, pain down left anterior leg to knee. No previous injury, no surgery.  EXAM: LUMBAR SPINE - 2-3 VIEW  COMPARISON:  03/28/2015  FINDINGS: Minimal facet hypertrophy in the LOWER lumbar levels. Minimal disc height loss at L4-5. No acute fracture or traumatic subluxation. Insert lesions there is atherosclerotic calcification of the abdominal aorta. Moderate stool burden. Previous hernia repair.  IMPRESSION: Mild degenerative changes. No evidence for acute abnormality.   Electronically Signed By: Anitra Barn M.D. On: 09/16/2018 11:09  Knee Imaging: Knee-L DG 4 views: Results for orders placed during the hospital encounter of 10/19/17 DG Knee Complete 4 Views Left  Narrative CLINICAL DATA:  Pt reports slipping and falling at work, where her left leg bent under her and she fell on it. Pt states she has pain all over the knee and is unable to externally rotate.  EXAM: LEFT KNEE - COMPLETE 4+ VIEW  COMPARISON:  06/22/2012  FINDINGS: No evidence of fracture, dislocation, or joint effusion. No evidence of arthropathy or other focal bone abnormality. Soft tissues are unremarkable.  IMPRESSION: Negative.   Electronically Signed By: Nicoletta Barrier M.D. On: 10/19/2017 11:58  Complexity Note: Imaging results reviewed.                         ROS   Cardiovascular: {Hx; Cardiovascular History:210120525} Pulmonary or Respiratory: {Hx; Pumonary and/or Respiratory History:210120523} Neurological: {Hx; Neurological:210120504} Psychological-Psychiatric: {Hx; Psychological-Psychiatric History:210120512} Gastrointestinal: {Hx; Gastrointestinal:210120527} Genitourinary: {Hx; Genitourinary:210120506} Hematological: {Hx; Hematological:210120510} Endocrine: {Hx; Endocrine history:210120509} Rheumatologic: {Hx; Rheumatological:210120530} Musculoskeletal: {Hx; Musculoskeletal:210120528} Work History: {Hx; Work history:210120514}  Allergies  Ms. Forgey is allergic to sulfa antibiotics.  Laboratory Chemistry Profile   Renal Lab Results  Component Value Date   BUN 15 01/04/2023   CREATININE 0.79 01/04/2023   GFRAA >60 03/28/2015   GFRNONAA >60 01/04/2023   PROTEINUR NEGATIVE 01/04/2023     Electrolytes Lab Results  Component Value Date   NA 135 01/04/2023   K 2.6 (LL) 01/04/2023   CL 98 01/04/2023   CALCIUM 9.3 01/04/2023   MG 1.8 01/04/2023     Hepatic Lab Results  Component Value Date   AST 19 01/04/2023   ALT 17 01/04/2023   ALBUMIN 4.7 01/04/2023   ALKPHOS 55 01/04/2023   LIPASE 31 01/04/2023     ID Lab Results  Component Value Date   HCVAB >  11.0 (H) 03/28/2015     Bone No results found for: "VD25OH", "VD125OH2TOT", "WU9811BJ4", "NW2956OZ3", "25OHVITD1", "25OHVITD2", "25OHVITD3", "TESTOFREE", "TESTOSTERONE"   Endocrine Lab Results  Component Value Date   GLUCOSE 112 (H) 01/04/2023   GLUCOSEU NEGATIVE 01/04/2023     Neuropathy No results found for: "VITAMINB12", "FOLATE", "HGBA1C", "HIV"   CNS No results found for: "COLORCSF", "APPEARCSF", "RBCCOUNTCSF", "WBCCSF", "POLYSCSF", "LYMPHSCSF", "EOSCSF", "PROTEINCSF", "GLUCCSF", "JCVIRUS", "CSFOLI", "IGGCSF", "LABACHR", "ACETBL"   Inflammation (CRP: Acute  ESR: Chronic) No results found for: "CRP", "ESRSEDRATE", "LATICACIDVEN"   Rheumatology Lab Results   Component Value Date   ANA Negative 03/28/2015     Coagulation Lab Results  Component Value Date   PLT 162 01/04/2023     Cardiovascular Lab Results  Component Value Date   HGB 14.2 01/04/2023   HCT 40.1 01/04/2023     Screening Lab Results  Component Value Date   HCVAB >11.0 (H) 03/28/2015     Cancer No results found for: "CEA", "CA125", "LABCA2"   Allergens No results found for: "ALMOND", "APPLE", "ASPARAGUS", "AVOCADO", "BANANA", "BARLEY", "BASIL", "BAYLEAF", "GREENBEAN", "LIMABEAN", "WHITEBEAN", "BEEFIGE", "REDBEET", "BLUEBERRY", "BROCCOLI", "CABBAGE", "MELON", "CARROT", "CASEIN", "CASHEWNUT", "CAULIFLOWER", "CELERY"     Note: Lab results reviewed.  PFSH  Drug: Ms. Rieb  reports no history of drug use. Alcohol:  reports current alcohol use of about 4.0 standard drinks of alcohol per week. Tobacco:  reports that she has been smoking cigarettes. She has a 10 pack-year smoking history. She has never used smokeless tobacco. Medical:  has a past medical history of Cancer (HCC) and Hypertension. Family: family history is not on file.  Past Surgical History:  Procedure Laterality Date   RECTAL SURGERY     Active Ambulatory Problems    Diagnosis Date Noted   Abdominal bloating 12/13/2023   Abdominal pain 09/14/2023   Anorectal pain 04/23/2022   Caregiver stress 01/10/2018   Carpal tunnel syndrome 12/20/2002   Chronic hepatitis C without hepatic coma (HCC) 02/11/2015   Cirrhosis of liver without ascites (HCC) 01/14/2016   Constipation due to outlet dysfunction 04/14/2023   Dermatitis 06/22/2019   Elevation of level of transaminase and lactic acid dehydrogenase (LDH) 12/28/2014   Hypertension 10/31/2007   Numbness 12/03/2014   Other specified anxiety disorders 01/10/2018   Primary squamous cell carcinoma of anus (HCC) 12/28/2012   Right upper quadrant abdominal pain 12/13/2023   Sciatica 12/09/2014   Shingles 01/31/2020   Small intestinal bacterial overgrowth,  unspecified 09/14/2023   Chronic pain syndrome 12/21/2023   Pharmacologic therapy 12/21/2023   Disorder of skeletal system 12/21/2023   Problems influencing health status 12/21/2023   Resolved Ambulatory Problems    Diagnosis Date Noted   No Resolved Ambulatory Problems   Past Medical History:  Diagnosis Date   Cancer (HCC)    Constitutional Exam  General appearance: Well nourished, well developed, and well hydrated. In no apparent acute distress There were no vitals filed for this visit. BMI Assessment: Estimated body mass index is 21.8 kg/m as calculated from the following:   Height as of 01/04/23: 5\' 5"  (1.651 m).   Weight as of 01/04/23: 131 lb (59.4 kg).  BMI interpretation table: BMI level Category Range association with higher incidence of chronic pain  <18 kg/m2 Underweight   18.5-24.9 kg/m2 Ideal body weight   25-29.9 kg/m2 Overweight Increased incidence by 20%  30-34.9 kg/m2 Obese (Class I) Increased incidence by 68%  35-39.9 kg/m2 Severe obesity (Class II) Increased incidence by 136%  >40 kg/m2 Extreme obesity (Class  III) Increased incidence by 254%   Patient's current BMI Ideal Body weight  There is no height or weight on file to calculate BMI. Patient weight not recorded   BMI Readings from Last 4 Encounters:  01/04/23 21.80 kg/m  01/26/20 20.98 kg/m  05/09/19 21.30 kg/m  09/16/18 20.05 kg/m   Wt Readings from Last 4 Encounters:  01/04/23 131 lb (59.4 kg)  01/26/20 138 lb (62.6 kg)  05/09/19 136 lb (61.7 kg)  09/16/18 128 lb (58.1 kg)    Psych/Mental status: Alert, oriented x 3 (person, place, & time)       Eyes: PERLA Respiratory: No evidence of acute respiratory distress  Assessment  Primary Diagnosis & Pertinent Problem List: The primary encounter diagnosis was Chronic pain syndrome. Diagnoses of Pharmacologic therapy, Disorder of skeletal system, and Problems influencing health status were also pertinent to this visit.  Visit Diagnosis (New  problems to examiner): 1. Chronic pain syndrome   2. Pharmacologic therapy   3. Disorder of skeletal system   4. Problems influencing health status    Plan of Care (Initial workup plan)  Note: Ms. Tebeau was reminded that as per protocol, today's visit has been an evaluation only. We have not taken over the patient's controlled substance management.  Problem-specific plan: Assessment and Plan            Lab Orders  No laboratory test(s) ordered today   Imaging Orders  No imaging studies ordered today   Referral Orders  No referral(s) requested today   Procedure Orders    No procedure(s) ordered today   Pharmacotherapy (current): Medications ordered:  No orders of the defined types were placed in this encounter.  Medications administered during this visit: Aleksia K. Rabenold had no medications administered during this visit.   Analgesic Pharmacotherapy:  Opioid Analgesics: For patients currently taking or requesting to take opioid analgesics, in accordance with [place]  Medical Board Guidelines, we will assess their risks and indications for the use of these substances. After completing our evaluation, we may offer recommendations, but we no longer take patients for medication management. The prescribing physician will ultimately decide, based on his/her training and level of comfort whether to adopt any of the recommendations, including whether or not to prescribe such medicines.  Membrane stabilizer: To be determined at a later time  Muscle relaxant: To be determined at a later time  NSAID: To be determined at a later time  Other analgesic(s): To be determined at a later time   Interventional management options: Ms. Huet was informed that there is no guarantee that she would be a candidate for interventional therapies. The decision will be based on the results of diagnostic studies, as well as Ms. Williamsen's risk profile.  Procedure(s) under consideration:  Pending  results of ordered studies     Interventional Therapies  Risk Factors  Considerations  Medical Comorbidities:     Planned  Pending:      Under consideration:   Pending   Completed:   None at this time   Therapeutic  Palliative (PRN) options:   None established   Completed by other providers:   None reported     Provider-requested follow-up: No follow-ups on file.  Future Appointments  Date Time Provider Department Center  12/22/2023  1:00 PM Renaldo Caroli, MD [facility] None   I discussed the assessment and treatment plan with the patient. The patient was provided an opportunity to ask questions and all were answered. The patient agreed with the  plan and demonstrated an understanding of the instructions.  Patient advised to call back or seek an in-person evaluation if the symptoms or condition worsens.  Duration of encounter: *** minutes.  Total time on encounter, as per AMA guidelines included both the face-to-face and non-face-to-face time personally spent by the physician and/or other qualified health care professional(s) on the day of the encounter (includes time in activities that require the physician or other qualified health care professional and does not include time in activities normally performed by clinical staff). Physician's time may include the following activities when performed: Preparing to see the patient (e.g., pre-charting review of records, searching for previously ordered imaging, lab work, and nerve conduction tests) Review of prior analgesic pharmacotherapies. Reviewing PMP Interpreting ordered tests (e.g., lab work, imaging, nerve conduction tests) Performing post-procedure evaluations, including interpretation of diagnostic procedures Obtaining and/or reviewing separately obtained history Performing a medically appropriate examination and/or evaluation Counseling and educating the patient/family/caregiver Ordering medications, tests, or  procedures Referring and communicating with other health care professionals (when not separately reported) Documenting clinical information in the electronic or other health record Independently interpreting results (not separately reported) and communicating results to the patient/ family/caregiver Care coordination (not separately reported)  Note by: Candi Chafe, MD (TTS and AI technology used. I apologize for any typographical errors that were not detected and corrected.) Date: 12/22/2023; Time: 4:40 PM

## 2023-12-22 ENCOUNTER — Encounter (INDEPENDENT_AMBULATORY_CARE_PROVIDER_SITE_OTHER): Admitting: Nurse Practitioner

## 2023-12-22 ENCOUNTER — Ambulatory Visit (HOSPITAL_BASED_OUTPATIENT_CLINIC_OR_DEPARTMENT_OTHER): Admitting: Pain Medicine

## 2023-12-22 DIAGNOSIS — G894 Chronic pain syndrome: Secondary | ICD-10-CM

## 2023-12-22 DIAGNOSIS — Z79899 Other long term (current) drug therapy: Secondary | ICD-10-CM

## 2023-12-22 DIAGNOSIS — Z91199 Patient's noncompliance with other medical treatment and regimen due to unspecified reason: Secondary | ICD-10-CM

## 2023-12-22 DIAGNOSIS — Z789 Other specified health status: Secondary | ICD-10-CM

## 2023-12-22 DIAGNOSIS — M899 Disorder of bone, unspecified: Secondary | ICD-10-CM

## 2023-12-22 DIAGNOSIS — G8929 Other chronic pain: Secondary | ICD-10-CM

## 2023-12-27 ENCOUNTER — Encounter (INDEPENDENT_AMBULATORY_CARE_PROVIDER_SITE_OTHER): Payer: Self-pay

## 2023-12-29 ENCOUNTER — Ambulatory Visit: Admitting: Pain Medicine
